# Patient Record
Sex: Male | Born: 1990 | Race: Black or African American | Hispanic: No | Marital: Single | Smoking: Current every day smoker
Health system: Southern US, Community
[De-identification: ages and names within clinical notes are randomized; demographics above are authoritative.]

## PROBLEM LIST (undated history)

## (undated) HISTORY — PX: APPENDECTOMY: SHX54

---

## 2017-02-04 ENCOUNTER — Encounter (HOSPITAL_COMMUNITY)
Admission: EM | Disposition: A | Discharge status: Home / Self Care | Payer: Self-pay | Source: Home / Self Care | Attending: Emergency Medicine

## 2017-02-04 ENCOUNTER — Emergency Department (HOSPITAL_COMMUNITY): Payer: BC Managed Care – PPO

## 2017-02-04 ENCOUNTER — Observation Stay (HOSPITAL_COMMUNITY)
Admission: EM | Admit: 2017-02-04 | Discharge: 2017-02-05 | Disposition: A | Discharge status: Home / Self Care | Payer: BC Managed Care – PPO | Attending: General Surgery | Admitting: General Surgery

## 2017-02-04 ENCOUNTER — Encounter (HOSPITAL_COMMUNITY): Payer: Self-pay | Admitting: *Deleted

## 2017-02-04 DIAGNOSIS — R109 Unspecified abdominal pain: Secondary | ICD-10-CM | POA: Diagnosis present

## 2017-02-04 DIAGNOSIS — K3580 Unspecified acute appendicitis: Principal | ICD-10-CM | POA: Insufficient documentation

## 2017-02-04 DIAGNOSIS — F17200 Nicotine dependence, unspecified, uncomplicated: Secondary | ICD-10-CM | POA: Diagnosis not present

## 2017-02-04 DIAGNOSIS — K37 Unspecified appendicitis: Secondary | ICD-10-CM | POA: Diagnosis present

## 2017-02-04 DIAGNOSIS — K353 Acute appendicitis with localized peritonitis, without perforation or gangrene: Secondary | ICD-10-CM

## 2017-02-04 HISTORY — PX: LAPAROSCOPIC APPENDECTOMY: SHX408

## 2017-02-04 LAB — COMPREHENSIVE METABOLIC PANEL
ALT: 19 U/L (ref 17–63)
AST: 36 U/L (ref 15–41)
Albumin: 3.4 g/dL — ABNORMAL LOW (ref 3.5–5.0)
Alkaline Phosphatase: 57 U/L (ref 38–126)
Anion gap: 8 (ref 5–15)
BUN: 7 mg/dL (ref 6–20)
CHLORIDE: 104 mmol/L (ref 101–111)
CO2: 26 mmol/L (ref 22–32)
Calcium: 8.9 mg/dL (ref 8.9–10.3)
Creatinine, Ser: 1.1 mg/dL (ref 0.61–1.24)
GFR calc non Af Amer: >60 mL/min (ref >60)
Glucose, Bld: 97 mg/dL (ref 65–99)
Potassium: 4.7 mmol/L (ref 3.5–5.1)
SODIUM: 138 mmol/L (ref 135–145)
Total Bilirubin: 1.2 mg/dL (ref 0.3–1.2)
Total Protein: 6.9 g/dL (ref 6.5–8.1)

## 2017-02-04 LAB — URINALYSIS, ROUTINE W REFLEX MICROSCOPIC
BILIRUBIN URINE: NEGATIVE
GLUCOSE, UA: NEGATIVE mg/dL
HGB URINE DIPSTICK: NEGATIVE
Ketones, ur: NEGATIVE mg/dL
Leukocytes, UA: NEGATIVE
Nitrite: NEGATIVE
PH: 7 (ref 5.0–8.0)
Protein, ur: NEGATIVE mg/dL

## 2017-02-04 LAB — LIPASE, BLOOD: Lipase: 18 U/L (ref 11–51)

## 2017-02-04 LAB — CBC
HCT: 40.6 % (ref 39.0–52.0)
HEMOGLOBIN: 13.7 g/dL (ref 13.0–17.0)
MCH: 28.1 pg (ref 26.0–34.0)
MCHC: 33.7 g/dL (ref 30.0–36.0)
MCV: 83.2 fL (ref 78.0–100.0)
Platelets: 196 10*3/uL (ref 150–400)
RBC: 4.88 MIL/uL (ref 4.22–5.81)
RDW: 14.5 % (ref 11.5–15.5)
WBC: 4.3 10*3/uL (ref 4.0–10.5)

## 2017-02-04 SURGERY — APPENDECTOMY, LAPAROSCOPIC
Anesthesia: General | Site: Abdomen

## 2017-02-04 MED ORDER — MIDAZOLAM HCL 2 MG/2ML IJ SOLN
INTRAMUSCULAR | Status: AC
  Filled 2017-02-04: qty 2

## 2017-02-04 MED ORDER — DEXTROSE 5 % IV SOLN
2.0000 g | INTRAVENOUS | Status: DC
  Administered 2017-02-05: 2 g via INTRAVENOUS
  Filled 2017-02-04 (×2): qty 2

## 2017-02-04 MED ORDER — SODIUM CHLORIDE 0.9 % IV BOLUS (SEPSIS)
1000.0000 mL | Freq: Once | INTRAVENOUS | Status: AC
  Administered 2017-02-04: 1000 mL via INTRAVENOUS

## 2017-02-04 MED ORDER — PROPOFOL 10 MG/ML IV BOLUS
INTRAVENOUS | Status: AC
  Filled 2017-02-04: qty 20

## 2017-02-04 MED ORDER — IOPAMIDOL (ISOVUE-300) INJECTION 61%
INTRAVENOUS | Status: AC
  Administered 2017-02-04: 100 mL
  Filled 2017-02-04: qty 100

## 2017-02-04 MED ORDER — ONDANSETRON HCL 4 MG/2ML IJ SOLN: 4.0000 mg | Freq: Four times a day (QID) | INTRAMUSCULAR | Status: DC | PRN

## 2017-02-04 MED ORDER — ACETAMINOPHEN 650 MG RE SUPP: 650.0000 mg | Freq: Four times a day (QID) | RECTAL | Status: DC | PRN

## 2017-02-04 MED ORDER — ENOXAPARIN SODIUM 40 MG/0.4ML ~~LOC~~ SOLN
40.0000 mg | SUBCUTANEOUS | Status: DC
  Administered 2017-02-05: 40 mg via SUBCUTANEOUS
  Filled 2017-02-04: qty 0.4

## 2017-02-04 MED ORDER — ONDANSETRON 4 MG PO TBDP: 4.0000 mg | ORAL_TABLET | Freq: Four times a day (QID) | ORAL | Status: DC | PRN

## 2017-02-04 MED ORDER — METRONIDAZOLE IN NACL 5-0.79 MG/ML-% IV SOLN
500.0000 mg | Freq: Three times a day (TID) | INTRAVENOUS | Status: DC
  Administered 2017-02-05 (×2): 500 mg via INTRAVENOUS
  Filled 2017-02-04 (×4): qty 100

## 2017-02-04 MED ORDER — MORPHINE SULFATE (PF) 4 MG/ML IV SOLN
2.0000 mg | INTRAVENOUS | Status: DC | PRN
  Administered 2017-02-05: 2 mg via INTRAVENOUS
  Filled 2017-02-04: qty 1

## 2017-02-04 MED ORDER — FENTANYL CITRATE (PF) 100 MCG/2ML IJ SOLN
INTRAMUSCULAR | Status: AC
  Filled 2017-02-04: qty 2

## 2017-02-04 MED ORDER — ACETAMINOPHEN 325 MG PO TABS: 650.0000 mg | ORAL_TABLET | Freq: Four times a day (QID) | ORAL | Status: DC | PRN

## 2017-02-04 MED ORDER — SODIUM CHLORIDE 0.9 % IV SOLN
INTRAVENOUS | Status: DC
  Administered 2017-02-04: 23:00:00 via INTRAVENOUS

## 2017-02-04 SURGICAL SUPPLY — 40 items
APPLIER CLIP ROT 10 11.4 M/L (STAPLE)
CANISTER SUCT 3000ML PPV (MISCELLANEOUS) ×3 IMPLANT
CHLORAPREP W/TINT 26ML (MISCELLANEOUS) ×3 IMPLANT
CLIP APPLIE ROT 10 11.4 M/L (STAPLE) IMPLANT
CLOSURE WOUND 1/2 X4 (GAUZE/BANDAGES/DRESSINGS) ×1
COVER SURGICAL LIGHT HANDLE (MISCELLANEOUS) ×3 IMPLANT
CUTTER FLEX LINEAR 45M (STAPLE) ×3 IMPLANT
DERMABOND ADVANCED (GAUZE/BANDAGES/DRESSINGS) ×2
DERMABOND ADVANCED .7 DNX12 (GAUZE/BANDAGES/DRESSINGS) ×1 IMPLANT
DEVICE TROCAR PUNCTURE CLOSURE (ENDOMECHANICALS) ×3 IMPLANT
ELECT REM PT RETURN 9FT ADLT (ELECTROSURGICAL) ×3
ELECTRODE REM PT RTRN 9FT ADLT (ELECTROSURGICAL) ×1 IMPLANT
GLOVE BIO SURGEON STRL SZ7 (GLOVE) ×3 IMPLANT
GLOVE BIOGEL PI IND STRL 7.5 (GLOVE) ×1 IMPLANT
GLOVE BIOGEL PI INDICATOR 7.5 (GLOVE) ×2
GOWN STRL REUS W/ TWL LRG LVL3 (GOWN DISPOSABLE) ×3 IMPLANT
GOWN STRL REUS W/TWL LRG LVL3 (GOWN DISPOSABLE) ×6
KIT BASIN OR (CUSTOM PROCEDURE TRAY) ×3 IMPLANT
KIT ROOM TURNOVER OR (KITS) ×3 IMPLANT
NS IRRIG 1000ML POUR BTL (IV SOLUTION) ×3 IMPLANT
PAD ARMBOARD 7.5X6 YLW CONV (MISCELLANEOUS) ×6 IMPLANT
POUCH RETRIEVAL ECOSAC 10 (ENDOMECHANICALS) ×1 IMPLANT
POUCH RETRIEVAL ECOSAC 10MM (ENDOMECHANICALS) ×2
RELOAD 45 VASCULAR/THIN (ENDOMECHANICALS) IMPLANT
RELOAD STAPLE TA45 3.5 REG BLU (ENDOMECHANICALS) ×3 IMPLANT
SCISSORS LAP 5X35 DISP (ENDOMECHANICALS) ×3 IMPLANT
SET IRRIG TUBING LAPAROSCOPIC (IRRIGATION / IRRIGATOR) ×3 IMPLANT
SHEARS HARMONIC ACE PLUS 36CM (ENDOMECHANICALS) ×3 IMPLANT
SLEEVE ENDOPATH XCEL 5M (ENDOMECHANICALS) ×3 IMPLANT
SPECIMEN JAR SMALL (MISCELLANEOUS) ×3 IMPLANT
STRIP CLOSURE SKIN 1/2X4 (GAUZE/BANDAGES/DRESSINGS) ×2 IMPLANT
SUT MNCRL AB 4-0 PS2 18 (SUTURE) ×6 IMPLANT
SUT VICRYL 0 UR6 27IN ABS (SUTURE) ×6 IMPLANT
TOWEL OR 17X24 6PK STRL BLUE (TOWEL DISPOSABLE) ×3 IMPLANT
TOWEL OR 17X26 10 PK STRL BLUE (TOWEL DISPOSABLE) ×3 IMPLANT
TRAY FOLEY CATH 16FR SILVER (SET/KITS/TRAYS/PACK) ×3 IMPLANT
TRAY LAPAROSCOPIC MC (CUSTOM PROCEDURE TRAY) ×3 IMPLANT
TROCAR XCEL BLUNT TIP 100MML (ENDOMECHANICALS) ×3 IMPLANT
TROCAR XCEL NON-BLD 5MMX100MML (ENDOMECHANICALS) ×3 IMPLANT
TUBING INSUFFLATION (TUBING) ×3 IMPLANT

## 2017-02-04 NOTE — Consult Note (Signed)
Matthew Miranda Jan 15, 1991  [id number].    Requesting MD: Waynetta Pean PA-C Chief Complaint/Reason for Consult: Abdominal pain   HPI:   Matthew Miranda is a 26 year old male with no significant past medical history.  The patient presented to the ED earlier today with a 3 day history of abdominal pain. He states that it began on Tuesday and has been fairly constant since then. He notes that it is mostly in the lower quadrants, with the worst pain at the lower left quadrant. The patient reports that the pain is mild when he is laying down or resting, but movement and touch exacerbate the pain. He denies any other symptoms of fever, chills, nausea, vomiting, diarrhea, constipation. Other than the continuous abdominal pain, the patient states he has been doing well.   He denies any previous episodes of pain that are similar to this and has never had any abdominal surgeries. He reports that his last BM was last night. He has been NPO since 11AM this morning.   ROS: All systems reviewed and otherwise negative except for as above.  History reviewed. No pertinent family history.  History reviewed. No pertinent past medical history.  History reviewed. No pertinent surgical history.  Social History:  reports that he has been smoking.  He does not have any smokeless tobacco history on file. He reports that he drinks alcohol. He reports that he uses drugs, including Marijuana.  Allergies: No Known Allergies  Medications: none  Blood pressure 130/84, pulse 62, temperature 98.2 F (36.8 C), temperature source Oral, resp. rate 16, SpO2 100 %. Physical Exam: General: pleasant, WD/WN male, that appears in NAD and in mild discomfort. HEENT: PERRL, sclera is without icterus, buccal mucosa is pink and moist. Heart: regular, rate, and rhythm.  No obvious murmurs, gallops, or rubs noted.  Palpable pedal pulses bilaterally  Lungs: Clear to  auscultation bilaterally. No accessory muscle usage is noted. Abd: Soft, with mild tenderness to palpation and rebound tenderness noted in the LLQ. No pain in other quadrants. Normoactive bowel sounds. No scars noted. MS: No cyanosis, clubbing, or edema. Skin: warm and dry with no masses, lesions, or rashes Psych: A&Ox3 with an appropriate affect.   Results for orders placed or performed during the hospital encounter of 02/04/17 (from the past 48 hour(s))  Lipase, blood     Status: None   Collection Time: 02/04/17  1:14 PM  Result Value Ref Range   Lipase 18 11 - 51 U/L  Comprehensive metabolic panel     Status: Abnormal   Collection Time: 02/04/17  1:14 PM  Result Value Ref Range   Sodium 138 135 - 145 mmol/L   Potassium 4.7 3.5 - 5.1 mmol/L    Comment: SPECIMEN HEMOLYZED. HEMOLYSIS MAY AFFECT INTEGRITY OF RESULTS.   Chloride 104 101 - 111 mmol/L   CO2 26 22 - 32 mmol/L   Glucose, Bld 97 65 - 99 mg/dL   BUN 7 6 - 20 mg/dL   Creatinine, Ser 1.10 0.61 - 1.24 mg/dL   Calcium 8.9 8.9 - 10.3 mg/dL   Total Protein 6.9 6.5 - 8.1 g/dL   Albumin 3.4 (L) 3.5 - 5.0 g/dL   AST 36 15 - 41 U/L   ALT 19 17 - 63 U/L   Alkaline Phosphatase 57 38 - 126 U/L   Total Bilirubin 1.2 0.3 - 1.2 mg/dL   GFR calc non Af Amer >60 >60 mL/min   GFR calc  Af Amer >60 >60 mL/min    Comment: (NOTE) The eGFR has been calculated using the CKD EPI equation. This calculation has not been validated in all clinical situations. eGFR's persistently <60 mL/min signify possible Chronic Kidney Disease.    Anion gap 8 5 - 15  CBC     Status: None   Collection Time: 02/04/17  1:14 PM  Result Value Ref Range   WBC 4.3 4.0 - 10.5 K/uL   RBC 4.88 4.22 - 5.81 MIL/uL   Hemoglobin 13.7 13.0 - 17.0 g/dL   HCT 40.6 39.0 - 52.0 %   MCV 83.2 78.0 - 100.0 fL   MCH 28.1 26.0 - 34.0 pg   MCHC 33.7 30.0 - 36.0 g/dL   RDW 14.5 11.5 - 15.5 %   Platelets 196 150 - 400 K/uL   Ct Abdomen Pelvis W Contrast  Result Date:  02/04/2017 CLINICAL DATA:  Right lower quadrant pain with nausea EXAM: CT ABDOMEN AND PELVIS WITH CONTRAST TECHNIQUE: Multidetector CT imaging of the abdomen and pelvis was performed using the standard protocol following bolus administration of intravenous contrast. CONTRAST:  181m ISOVUE-300 IOPAMIDOL (ISOVUE-300) INJECTION 61% COMPARISON:  None. FINDINGS: Lower chest: No acute abnormality. Hepatobiliary: No focal liver abnormality is seen. No gallstones, gallbladder wall thickening, or biliary dilatation. Pancreas: Unremarkable. No pancreatic ductal dilatation or surrounding inflammatory changes. Spleen: Normal in size without focal abnormality. Adrenals/Urinary Tract: Adrenal glands are unremarkable. Kidneys are normal, without renal calculi, focal lesion, or hydronephrosis. Bladder is unremarkable. Stomach/Bowel: Stomach is nonenlarged. No dilated small bowel. No colon wall thickening. Abnormal appendix in the right lower quadrant. Appendix measures up to 11 mm and demonstrates wall enhancement and surrounding inflammatory changes. This would be consistent with acute appendicitis. No extraluminal gas. No abscess. Vascular/Lymphatic: Non aneurysmal aorta. Enlarged lymph node at the iliac bifurcation measuring 8 mm. Few lymph nodes along the mesenteric root, likely reactive. Reproductive: Prostate is unremarkable. Other: No abdominal wall hernia or abnormality. Trace free fluid in the pelvis. Musculoskeletal: No acute or significant osseous findings. IMPRESSION: 1. Abnormally enlarged appendix with wall enhancement and surrounding edema, consistent with acute appendicitis. No extraluminal gas, abscess or free air 2. Trace free fluid in the pelvis Electronically Signed   By: KDonavan FoilM.D.   On: 02/04/2017 18:32      Assessment/Plan 1. Acute appendicitis: - Patient noted to have an enlarged appendix on CT; Will admit patient for surgical intervention to be performed at a later point.; - Continue pain  management as needed; Continue NPO until surgery time can be scheduled. - Start antibiotic therapy  NRoselle Locus PA-S  rlq pain and tenderness on his examination, ct scan is consistent with appendicitis, I discussed lap appy with risks and doing this tonight. Will give abx and admit, have trauma that is currently being evaluated.  Discussed open procedure, injury and possible postop abscess

## 2017-02-04 NOTE — ED Notes (Signed)
Attempted to call report

## 2017-02-04 NOTE — ED Provider Notes (Signed)
[facility]-EMERGENCY DEPT Provider Note   CSN: [id number] Arrival date & time: 02/04/17  1258     History   Chief Complaint Chief Complaint  Patient presents with  . Abdominal Pain    HPI Matthew Miranda is a 26 y.o. male.  Matthew Miranda is a 26 y.o. Male who is otherwise healthy who presents to the ED complaining of right lower quadrant abdominal pain for the past three days. He reports the pain is mostly constant and today can be worse with movement. He denies any nausea, vomiting or diarrhea. He denies colicky pain.  No previous abdominal surgeries. No treatments attempted prior to arrival. Patient denies fevers, recent illness, testicle pain, penile pain, back pain, nausea, vomiting, diarrhea, cough, chest pain or shortness of breath.   The history is provided by the patient, medical records and a parent. No language interpreter was used.  Abdominal Pain   Pertinent negatives include fever, diarrhea, nausea, vomiting, constipation, dysuria, frequency, hematuria and headaches.    History reviewed. No pertinent past medical history.  Patient Active Problem List   Diagnosis Date Noted  . Appendicitis 02/04/2017    History reviewed. No pertinent surgical history.     Home Medications    Prior to Admission medications   Not on File    Family History History reviewed. No pertinent family history.  Social History Social History  Substance Use Topics  . Smoking status: Current Every Day Smoker  . Smokeless tobacco: Not on file  . Alcohol use Yes     Allergies   Patient has no known allergies.   Review of Systems Review of Systems  Constitutional: Negative for chills and fever.  HENT: Negative for congestion and sore throat.   Eyes: Negative for visual disturbance.  Respiratory: Negative for cough and shortness of breath.   Cardiovascular: Negative for chest pain.  Gastrointestinal: Positive for abdominal pain. Negative for abdominal  distention, blood in stool, constipation, diarrhea, nausea and vomiting.  Genitourinary: Negative for decreased urine volume, difficulty urinating, dysuria, frequency, hematuria, penile pain and testicular pain.  Musculoskeletal: Negative for back pain and neck pain.  Skin: Negative for rash.  Neurological: Negative for headaches.     Physical Exam Updated Vital Signs BP 131/79 (BP Location: Left Arm)   Pulse 60   Temp 98.6 F (37 C) (Oral)   Resp 18   Ht 5\' 11"  (1.803 m)   Wt 94.5 kg   SpO2 100%   BMI 29.05 kg/m   Physical Exam  Constitutional: He appears well-developed and well-nourished. No distress.  Nontoxic appearing.  HENT:  Head: Normocephalic and atraumatic.  Mouth/Throat: Oropharynx is clear and moist.  Eyes: Conjunctivae are normal. Pupils are equal, round, and reactive to light. Right eye exhibits no discharge. Left eye exhibits no discharge.  Neck: Neck supple.  Cardiovascular: Normal rate, regular rhythm, normal heart sounds and intact distal pulses.  Exam reveals no gallop and no friction rub.   No murmur heard. Pulmonary/Chest: Effort normal and breath sounds normal. No respiratory distress. He has no wheezes. He has no rales.  Abdominal: Soft. Bowel sounds are normal. He exhibits no distension and no mass. There is tenderness. There is guarding. There is no rebound. No hernia.  Abdomen is soft. Bowel sounds are present. Patient has right lower quadrant tenderness to palpation. No rebound tenderness. There is mild guarding with palpation of his right lower quadrant. No psoas or obturator sign. No CVA or flank tenderness.  Musculoskeletal: He exhibits no edema.  Lymphadenopathy:    He has no cervical adenopathy.  Neurological: He is alert. Coordination normal.  Skin: Skin is warm and dry. No rash noted. He is not diaphoretic. No erythema. No pallor.  Psychiatric: He has a normal mood and affect. His behavior is normal.  Nursing note and vitals  reviewed.    ED Treatments / Results  Labs (all labs ordered are listed, but only abnormal results are displayed) Labs Reviewed  COMPREHENSIVE METABOLIC PANEL - Abnormal; Notable for the following:       Result Value   Albumin 3.4 (*)    All other components within normal limits  URINALYSIS, ROUTINE W REFLEX MICROSCOPIC - Abnormal; Notable for the following:    APPearance HAZY (*)    Specific Gravity, Urine >1.046 (*)    All other components within normal limits  SURGICAL PCR SCREEN  LIPASE, BLOOD  CBC    EKG  EKG Interpretation None       Radiology Ct Abdomen Pelvis W Contrast  Result Date: 02/04/2017 CLINICAL DATA:  Right lower quadrant pain with nausea EXAM: CT ABDOMEN AND PELVIS WITH CONTRAST TECHNIQUE: Multidetector CT imaging of the abdomen and pelvis was performed using the standard protocol following bolus administration of intravenous contrast. CONTRAST:  ISOVUE-300 IOPAMIDOL (ISOVUE-300) INJECTION 61% COMPARISON:  None. FINDINGS: Lower chest: No acute abnormality. Hepatobiliary: No focal liver abnormality is seen. No gallstones, gallbladder wall thickening, or biliary dilatation. Pancreas: Unremarkable. No pancreatic ductal dilatation or surrounding inflammatory changes. Spleen: Normal in size without focal abnormality. Adrenals/Urinary Tract: Adrenal glands are unremarkable. Kidneys are normal, without renal calculi, focal lesion, or hydronephrosis. Bladder is unremarkable. Stomach/Bowel: Stomach is nonenlarged. No dilated small bowel. No colon wall thickening. Abnormal appendix in the right lower quadrant. Appendix measures up to 11 mm and demonstrates wall enhancement and surrounding inflammatory changes. This would be consistent with acute appendicitis. No extraluminal gas. No abscess. Vascular/Lymphatic: Non aneurysmal aorta. Enlarged lymph node at the iliac bifurcation measuring 8 mm. Few lymph nodes along the mesenteric root, likely reactive. Reproductive:  Prostate is unremarkable. Other: No abdominal wall hernia or abnormality. Trace free fluid in the pelvis. Musculoskeletal: No acute or significant osseous findings. IMPRESSION: 1. Abnormally enlarged appendix with wall enhancement and surrounding edema, consistent with acute appendicitis. No extraluminal gas, abscess or free air 2. Trace free fluid in the pelvis Electronically Signed   By: Jasmine Pang M.D.   On: 02/04/2017 18:32    Procedures Procedures (including critical care time)  Medications Ordered in ED Medications  enoxaparin (LOVENOX) injection 40 mg (not administered)  0.9 %  sodium chloride infusion ( Intravenous New Bag/Given 02/04/17 2312)  cefTRIAXone (ROCEPHIN) 2 g in dextrose 5 % 50 mL IVPB (not administered)    And  metroNIDAZOLE (FLAGYL) IVPB 500 mg (not administered)  acetaminophen (TYLENOL) tablet 650 mg (not administered)    Or  acetaminophen (TYLENOL) suppository 650 mg (not administered)  morphine 4 MG/ML injection 2 mg (not administered)  ondansetron (ZOFRAN-ODT) disintegrating tablet 4 mg (not administered)    Or  ondansetron (ZOFRAN) injection 4 mg (not administered)  sodium chloride 0.9 % bolus 1,000 mL (0 mLs Intravenous Stopped 02/04/17 1857)  iopamidol (ISOVUE-300) 61 % injection (100 mLs  Contrast Given 02/04/17 1810)     Initial Impression / Assessment and Plan / ED Course  I have reviewed the triage vital signs and the nursing notes.  Pertinent labs & imaging results that were available during my care of the patient were reviewed by me  and considered in my medical decision making (see chart for details).     This is a [age] y.o. Male who is otherwise healthy who presents to the ED complaining of right lower quadrant abdominal pain for the past three days. He reports the pain is mostly constant and today can be worse with movement. He denies any nausea, vomiting or diarrhea. He denies colicky pain.  No previous abdominal surgeries.  He was sent from urgent  care to rule out appendicitis. On exam the patient is afebrile nontoxic appearing. His abdomen is soft and his right lower quadrant abdominal tenderness to palpation. No psoas or obturator sign. Blood work is unremarkable. No leukocytosis.  CT abdomen and pelvis was obtained which showed evidence for acute appendicitis.  I discussed results with the patient who agrees with plan for general surgery consult.   I consulted with general surgeon Dr. Dwain SarnaWakefield who admitted the patient and plans to do appendectomy.    Final Clinical Impressions(s) / ED Diagnoses   Final diagnoses:  Acute appendicitis with localized peritonitis    New Prescriptions There are no discharge medications for this patient.    Everlene FarrierWilliam Panzy Bubeck, PA-C 02/05/17 [id number]    Rolan BuccoMelanie Belfi, MD 02/05/17 1047

## 2017-02-04 NOTE — ED Triage Notes (Signed)
Pt reports RLQ pain x 3 days. Denies fever or n/v.

## 2017-02-04 NOTE — ED Notes (Signed)
Pt reports unable to obtain urine sample at triage. 

## 2017-02-04 NOTE — ED Notes (Signed)
Attempted to call report. RN to call back. 

## 2017-02-04 NOTE — ED Notes (Addendum)
Surgeon at bedside.  

## 2017-02-04 NOTE — Anesthesia Preprocedure Evaluation (Addendum)
Anesthesia Evaluation  Patient identified by MRN, date of birth, ID band Patient awake    Reviewed: Allergy & Precautions  Airway Mallampati: I  TM Distance: >3 FB     Dental   Pulmonary Current Smoker,    breath sounds clear to auscultation       Cardiovascular negative cardio ROS   Rhythm:Regular Rate:Normal     Neuro/Psych    GI/Hepatic Neg liver ROS, History noted. CG   Endo/Other  negative endocrine ROS  Renal/GU negative Renal ROS     Musculoskeletal   Abdominal   Peds  Hematology   Anesthesia Other Findings   Reproductive/Obstetrics                            Anesthesia Physical Anesthesia Plan  ASA: I and emergent  Anesthesia Plan: General   Post-op Pain Management:    Induction: Intravenous, Rapid sequence and Cricoid pressure planned  Airway Management Planned:   Additional Equipment:   Intra-op Plan:   Post-operative Plan: Extubation in OR  Informed Consent: I have reviewed the patients History and Physical, chart, labs and discussed the procedure including the risks, benefits and alternatives for the proposed anesthesia with the patient or authorized representative who has indicated his/her understanding and acceptance.   Dental advisory given  Plan Discussed with: CRNA, Anesthesiologist and Surgeon  Anesthesia Plan Comments:        Anesthesia Quick Evaluation

## 2017-02-05 ENCOUNTER — Observation Stay (HOSPITAL_COMMUNITY): Payer: BC Managed Care – PPO | Admitting: Anesthesiology

## 2017-02-05 ENCOUNTER — Encounter (HOSPITAL_COMMUNITY): Payer: Self-pay | Admitting: General Surgery

## 2017-02-05 LAB — SURGICAL PCR SCREEN
MRSA, PCR: NEGATIVE
Staphylococcus aureus: NEGATIVE

## 2017-02-05 MED ORDER — 0.9 % SODIUM CHLORIDE (POUR BTL) OPTIME
TOPICAL | Status: DC | PRN
Start: 2017-02-05 — End: 2017-02-05
  Administered 2017-02-05: 200 mL

## 2017-02-05 MED ORDER — SODIUM CHLORIDE 0.9 % IR SOLN
Status: DC | PRN
  Administered 2017-02-05: 1000 mL

## 2017-02-05 MED ORDER — HYDROMORPHONE HCL 1 MG/ML IJ SOLN
INTRAMUSCULAR | Status: AC
  Filled 2017-02-05: qty 1

## 2017-02-05 MED ORDER — MIDAZOLAM HCL 5 MG/5ML IJ SOLN
INTRAMUSCULAR | Status: DC | PRN
  Administered 2017-02-05: 2 mg via INTRAVENOUS

## 2017-02-05 MED ORDER — ONDANSETRON HCL 4 MG/2ML IJ SOLN
INTRAMUSCULAR | Status: DC | PRN
  Administered 2017-02-05: 4 mg via INTRAVENOUS

## 2017-02-05 MED ORDER — PROPOFOL 10 MG/ML IV BOLUS
INTRAVENOUS | Status: DC | PRN
  Administered 2017-02-05: 200 mg via INTRAVENOUS

## 2017-02-05 MED ORDER — HYDROMORPHONE HCL 1 MG/ML IJ SOLN
INTRAMUSCULAR | Status: AC
Start: 2017-02-05 — End: 2017-02-05
  Filled 2017-02-05: qty 1

## 2017-02-05 MED ORDER — FENTANYL CITRATE (PF) 100 MCG/2ML IJ SOLN
INTRAMUSCULAR | Status: DC | PRN
  Administered 2017-02-05: 100 ug via INTRAVENOUS

## 2017-02-05 MED ORDER — OXYCODONE-ACETAMINOPHEN 5-325 MG PO TABS
1.0000 | ORAL_TABLET | ORAL | Status: DC | PRN
  Administered 2017-02-05: 2 via ORAL
  Filled 2017-02-05: qty 2

## 2017-02-05 MED ORDER — BUPIVACAINE HCL (PF) 0.25 % IJ SOLN
INTRAMUSCULAR | Status: AC
  Filled 2017-02-05: qty 30

## 2017-02-05 MED ORDER — SUGAMMADEX SODIUM 500 MG/5ML IV SOLN
INTRAVENOUS | Status: DC | PRN
  Administered 2017-02-05: 300 mg via INTRAVENOUS

## 2017-02-05 MED ORDER — ROCURONIUM BROMIDE 100 MG/10ML IV SOLN
INTRAVENOUS | Status: DC | PRN
Start: 2017-02-05 — End: 2017-02-05
  Administered 2017-02-05: 10 mg via INTRAVENOUS
  Administered 2017-02-05: 50 mg via INTRAVENOUS

## 2017-02-05 MED ORDER — HYDROMORPHONE HCL 1 MG/ML IJ SOLN
0.2500 mg | INTRAMUSCULAR | Status: DC | PRN
  Administered 2017-02-05: 0.5 mg via INTRAVENOUS

## 2017-02-05 MED ORDER — LACTATED RINGERS IV SOLN
INTRAVENOUS | Status: DC | PRN
  Administered 2017-02-05 (×2): via INTRAVENOUS

## 2017-02-05 MED ORDER — KETOROLAC TROMETHAMINE 15 MG/ML IJ SOLN
15.0000 mg | Freq: Three times a day (TID) | INTRAMUSCULAR | Status: DC | PRN
  Filled 2017-02-05: qty 1

## 2017-02-05 MED ORDER — BUPIVACAINE HCL (PF) 0.25 % IJ SOLN
INTRAMUSCULAR | Status: DC | PRN
Start: 2017-02-05 — End: 2017-02-05
  Administered 2017-02-05: 6 mL

## 2017-02-05 MED ORDER — OXYCODONE-ACETAMINOPHEN 5-325 MG PO TABS: 1.0000 | ORAL_TABLET | ORAL | 0 refills | Status: DC | PRN

## 2017-02-05 MED ORDER — LIDOCAINE HCL (CARDIAC) 20 MG/ML IV SOLN
INTRAVENOUS | Status: DC | PRN
  Administered 2017-02-05: 100 mg via INTRAVENOUS

## 2017-02-05 MED ORDER — HYDROMORPHONE HCL 1 MG/ML IJ SOLN
INTRAMUSCULAR | Status: DC | PRN
  Administered 2017-02-05 (×2): 1 mg via INTRAVENOUS

## 2017-02-05 MED ORDER — PHENYLEPHRINE HCL 10 MG/ML IJ SOLN
INTRAMUSCULAR | Status: DC | PRN
  Administered 2017-02-05: 80 ug via INTRAVENOUS

## 2017-02-05 MED ORDER — SUCCINYLCHOLINE CHLORIDE 20 MG/ML IJ SOLN
INTRAMUSCULAR | Status: DC | PRN
  Administered 2017-02-05: 120 mg via INTRAVENOUS

## 2017-02-05 NOTE — Discharge Instructions (Signed)
Please call to schedule a follow up appointment. Please arrive at least 30 min before your appointment to complete your check in paperwork.  If you are unable to arrive 30 min prior to your appointment time we may have to cancel or reschedule you.  LAPAROSCOPIC SURGERY: POST OP INSTRUCTIONS  1. DIET: Follow a light bland diet the first 24 hours after arrival home, such as soup, liquids, crackers, etc. Be sure to include lots of fluids daily. Avoid fast food or heavy meals as your are more likely to get nauseated. Eat a low fat the next few days after surgery.  2. Take your usually prescribed home medications unless otherwise directed. 3. PAIN CONTROL:  1. Pain is best controlled by a usual combination of three different methods TOGETHER:  1. Ice/Heat 2. Over the counter pain medication 3. Prescription pain medication 2. Most patients will experience some swelling and bruising around the incisions. Ice packs or heating pads (30-60 minutes up to 6 times a day) will help. Use ice for the first few days to help decrease swelling and bruising, then switch to heat to help relax tight/sore spots and speed recovery. Some people prefer to use ice alone, heat alone, alternating between ice & heat. Experiment to what works for you. Swelling and bruising can take several weeks to resolve.  3. It is helpful to take an over-the-counter pain medication regularly for the first few weeks. Choose one of the following that works best for you:  1. Naproxen (Aleve, etc) Two 220mg  tabs twice a day 2. Ibuprofen (Advil, etc) Three 200mg  tabs four times a day (every meal & bedtime) 3. Acetaminophen (Tylenol, etc) 500-650mg  four times a day (every meal & bedtime) 4. A prescription for pain medication (such as oxycodone, hydrocodone, etc) should be given to you upon discharge. Take your pain medication as prescribed.  1. If you are having problems/concerns with the prescription medicine (does not control pain, nausea,  vomiting, rash, itching, etc), please call us [phone] to see if we need to switch you to a different pain medicine that will work better for you and/or control your side effect better. 2. If you need a refill on your pain medication, please contact your pharmacy. They will contact our office to request authorization. Prescriptions will not be filled after 5 pm or on week-ends. 4. Avoid getting constipated. Between the surgery and the pain medications, it is common to experience some constipation. Increasing fluid intake and taking a fiber supplement (such as Metamucil, Citrucel, FiberCon, MiraLax, etc) 1-2 times a day regularly will usually help prevent this problem from occurring. A mild laxative (prune juice, Milk of Magnesia, MiraLax, etc) should be taken according to package directions if there are no bowel movements after 48 hours.  5. Watch out for diarrhea. If you have many loose bowel movements, simplify your diet to bland foods & liquids for a few days. Stop any stool softeners and decrease your fiber supplement. Switching to mild anti-diarrheal medications (Kayopectate, Pepto Bismol) can help. If this worsens or does not improve, please call us. 6. Wash / shower every day. You may shower over the dressings as they are waterproof. Continue to shower over incision(s) after the dressing is off. 7. Remove your waterproof bandages 5 days after surgery. You may leave the incision open to air. You may replace a dressing/Band-Aid to cover the incision for comfort if you wish.  8. ACTIVITIES as tolerated:  1. You may resume regular (light) daily activities beginning the  next day--such as daily self-care, walking, climbing stairs--gradually increasing activities as tolerated. If you can walk 30 minutes without difficulty, it is safe to try more intense activity such as jogging, treadmill, bicycling, low-impact aerobics, swimming, etc. 2. Save the most intensive and strenuous activity for last such as  sit-ups, heavy lifting, contact sports, etc Refrain from any heavy lifting or straining until you are off narcotics for pain control.  3. DO NOT PUSH THROUGH PAIN. Let pain be your guide: If it hurts to do something, don't do it. Pain is your body warning you to avoid that activity for another week until the pain goes down. 4. You may drive when you are no longer taking prescription pain medication, you can comfortably wear a seatbelt, and you can safely maneuver your car and apply brakes. 5. You may have sexual intercourse when it is comfortable.  9. FOLLOW UP in our office  1. Please call [facility] at [phone] to set up an appointment to see your surgeon in the office for a follow-up appointment approximately 2-3 weeks after your surgery. 2. Make sure that you call for this appointment the day you arrive home to insure a convenient appointment time.      10. IF YOU HAVE DISABILITY OR FAMILY LEAVE FORMS, BRING THEM TO THE               OFFICE FOR PROCESSING.   WHEN TO CALL US [phone]:  1. Poor pain control 2. Reactions / problems with new medications (rash/itching, nausea, etc)  3. Fever over 101.5 F (38.5 C) 4. Inability to urinate 5. Nausea and/or vomiting 6. Worsening swelling or bruising 7. Continued bleeding from incision. 8. Increased pain, redness, or drainage from the incision  The clinic staff is available to answer your questions during regular business hours (8:30am-5pm). Please dont hesitate to call and ask to speak to one of our nurses for clinical concerns.  If you have a medical emergency, go to the nearest emergency room or call 911.  A surgeon from [facility] is always on call at the [facility]   [facility] [place]  [address], [address], [place], [place] [address] ?  MAIN: [phone] ? TOLL FREE: [phone] ?  FAX [phone]  [email or web address]

## 2017-02-05 NOTE — Op Note (Signed)
Preoperative diagnosis: acute appendicitis Postoperative diagnosis: same as above Procedure: laparoscopic appendectomy Surgeon: Dr Harden MoMatt Adalynne Steffensmeier Anesthesia: general EBL: minimal Drains none Specimen: appendix to pathology Complications: none Sponge count correct at completion Disposition to recovery stable  Indications: This is a [age] yom with acute appendicitis on ct scan and by clinical exam.  We discussed laparoscopic appendectomy.   Procedure: After informed consent was obtained the patient was taken to the operating room. He was already onantibiotics. Sequential compression devices were on hislegs. Hewas placed under general anesthesia without complication. His abdomen was prepped and draped in the standard sterile surgical fashion. A surgical timeout was then performed.  I infiltrated marcaine belowthe umbilicus. I made a vertical incision and grasped the fascia. This was entered sharply. I then placed a 0 vicryl pursestring suture and inserted a Hasson trocarI then placed 2 other 5 mm trocars in llq and suprapubic region. I identified the appendix and grasped the base.  The base was clean. I divided the mesentery with the harmonic scalpel. I then divided the appendix. This was placed in a bag and removed. I viewed the staple line on the cecum and this appears to be intact and viable. There was no injury to cecum or small bowel noted. I then obtained hemostasis and irrigated.I removed the hasson trocar. I tied my pursestring down and added a 0vicryl stitchat the umbilical incision with the endoclose device. I then desufflated the abdomen and removed all my remaining trocars. I then closed these with 4-0 Monocryl and Dermabond. He tolerated this well was extubated and transferred to the recovery room in stable condition

## 2017-02-05 NOTE — Anesthesia Procedure Notes (Signed)
Procedure Name: Intubation Date/Time: 02/05/2017 12:44 AM Performed by: Manuela Schwartz B Pre-anesthesia Checklist: Patient identified, Emergency Drugs available, Suction available, Patient being monitored and Timeout performed Patient Re-evaluated:Patient Re-evaluated prior to inductionOxygen Delivery Method: Circle system utilized Preoxygenation: Pre-oxygenation with 100% oxygen Intubation Type: IV induction, Rapid sequence and Cricoid Pressure applied Laryngoscope Size: Mac and 3 Grade View: Grade I Tube type: Oral Tube size: 7.5 mm Number of attempts: 1 Airway Equipment and Method: Stylet Placement Confirmation: ETT inserted through vocal cords under direct vision,  positive ETCO2 and breath sounds checked- equal and bilateral Secured at: 23 cm Tube secured with: Tape Dental Injury: Teeth and Oropharynx as per pre-operative assessment

## 2017-02-05 NOTE — Anesthesia Postprocedure Evaluation (Signed)
Anesthesia Post Note  Patient: Matthew Miranda  Procedure(s) Performed: Procedure(s) (LRB): APPENDECTOMY LAPAROSCOPIC (N/A)  Patient location during evaluation: PACU Anesthesia Type: General Level of consciousness: awake Pain management: pain level controlled Vital Signs Assessment: post-procedure vital signs reviewed and stable Respiratory status: spontaneous breathing Cardiovascular status: stable Anesthetic complications: no       Last Vitals:  Vitals:   02/05/17 0300 02/05/17 0328  BP:  (!) 143/71  Pulse:  77  Resp:  16  Temp: 36.8 C 36.9 C    Last Pain:  Vitals:   02/05/17 0328  TempSrc: Oral  PainSc:                  [name]

## 2017-02-05 NOTE — Discharge Summary (Signed)
[facility] [facility]   Patient ID: Matthew Miranda MRN: [id number] DOB/AGE: 1991-04-02 26 y.o.  Admit date: 02/04/2017 Discharge date: 02/05/2017  Admitting Diagnosis: appendicitis  Discharge Diagnosis Patient Active Problem List   Diagnosis Date Noted  . Appendicitis 02/04/2017    Consultants None  Imaging: Ct Abdomen Pelvis W Contrast  Result Date: 02/04/2017 CLINICAL DATA:  Right lower quadrant pain with nausea EXAM: CT ABDOMEN AND PELVIS WITH CONTRAST TECHNIQUE: Multidetector CT imaging of the abdomen and pelvis was performed using the standard protocol following bolus administration of intravenous contrast. CONTRAST:  100mL ISOVUE-300 IOPAMIDOL (ISOVUE-300) INJECTION 61% COMPARISON:  None. FINDINGS: Lower chest: No acute abnormality. Hepatobiliary: No focal liver abnormality is seen. No gallstones, gallbladder wall thickening, or biliary dilatation. Pancreas: Unremarkable. No pancreatic ductal dilatation or surrounding inflammatory changes. Spleen: Normal in size without focal abnormality. Adrenals/Urinary Tract: Adrenal glands are unremarkable. Kidneys are normal, without renal calculi, focal lesion, or hydronephrosis. Bladder is unremarkable. Stomach/Bowel: Stomach is nonenlarged. No dilated small bowel. No colon wall thickening. Abnormal appendix in the right lower quadrant. Appendix measures up to 11 mm and demonstrates wall enhancement and surrounding inflammatory changes. This would be consistent with acute appendicitis. No extraluminal gas. No abscess. Vascular/Lymphatic: Non aneurysmal aorta. Enlarged lymph node at the iliac bifurcation measuring 8 mm. Few lymph nodes along the mesenteric root, likely reactive. Reproductive: Prostate is unremarkable. Other: No abdominal wall hernia or abnormality. Trace free fluid in the pelvis. Musculoskeletal: No acute or significant osseous findings. IMPRESSION: 1. Abnormally enlarged appendix with wall enhancement  and surrounding edema, consistent with acute appendicitis. No extraluminal gas, abscess or free air 2. Trace free fluid in the pelvis Electronically Signed   By: Matthew PangKim  Miranda M.D.   On: 02/04/2017 18:32    Procedures Dr. Dwain [facility] (02/05/17) - Laparoscopic Appendectomy  Hospital Course:  Matthew Geroldravores Miranda is a 26 year old male who [facility].  Workup showed appendicitis.  Patient was admitted and underwent procedure listed above.  Tolerated procedure well and was transferred to the floor.  Diet was advanced as tolerated.  On POD#0, the patient was voiding well, tolerating diet, ambulating well, pain well controlled, vital signs stable, incisions c/d/i and felt stable for discharge home.  Patient will follow up in our office in 2 weeks and knows to call with questions or concerns.  He will call to schedule an appointment for follow up.   Patient was discharged in good condition.  [place] Sub[place] controlled database was reviewed prior to prescribing narcotic pain medication to this patient.  Physical Exam: General:  Alert, NAD, pleasant, cooperative, well appearing Cardio: RRR, S1 & S2 normal, no murmur, rubs, gallops Resp: Effort normal, lungs CTA bilaterally, no wheezes, rales, rhonchi Abd:  Soft, ND, mild generalized tenderness worse around incisions, incisions C/D/I   Allergies as of 02/05/2017   No Known Allergies     Medication List    TAKE these medications   oxyCODONE-acetaminophen 5-325 MG tablet Commonly known as:  PERCOCET/ROXICET Take 1-2 tablets by mouth every 4 (four) hours as needed for moderate pain.        Follow-up Information    [facility] [place] Call.   Specialty:  General Surgery Why:  to schedule an appointment for follow up in our clinic in 2-3 weeks Contact information: [address] [place] [id number] [phone]          Signed: Joyce CopaJessica L [facility]    Surgery 02/05/2017, 9:57 AM Pager: [phone] Consults: [phone] Mon-Fri 7:00 am-4:30 pm Sat-Sun 7:00 am-11:30 am

## 2017-02-05 NOTE — Transfer of Care (Signed)
Immediate Anesthesia Transfer of Care Note  Patient: Matthew Miranda  Procedure(s) Performed: Procedure(s): APPENDECTOMY LAPAROSCOPIC (N/A)  Patient Location: PACU  Anesthesia Type:General  Level of Consciousness: awake, alert  and oriented  Airway & Oxygen Therapy: Patient Spontanous Breathing  Post-op Assessment: Report given to RN and Post -op Vital signs reviewed and stable  Post vital signs: Reviewed and stable  Last Vitals:  Vitals:   02/04/17 2200 02/04/17 2321  BP: 127/78 131/79  Pulse: 63 60  Resp:  18  Temp:  37 C    Last Pain:  Vitals:   02/04/17 2321  TempSrc: Oral  PainSc:          Complications: No apparent anesthesia complications

## 2017-02-05 NOTE — Progress Notes (Signed)
Discharge instructions gone over with patient. Home medications gone over. Prescription given. Follow up appointment to be made. Signs and symptoms of infection and or worsening condition gone over. Diet, activity, and reasons to call the doctor discussed. Work Physicist, medicalletter given. Patient verbalized understanding of instructions.

## 2018-02-28 ENCOUNTER — Encounter (HOSPITAL_COMMUNITY): Payer: Self-pay | Admitting: Emergency Medicine

## 2018-02-28 ENCOUNTER — Ambulatory Visit (HOSPITAL_COMMUNITY)
Admission: EM | Admit: 2018-02-28 | Discharge: 2018-02-28 | Disposition: A | Discharge status: Home / Self Care | Payer: Self-pay | Attending: Family Medicine | Admitting: Family Medicine

## 2018-02-28 DIAGNOSIS — K529 Noninfective gastroenteritis and colitis, unspecified: Secondary | ICD-10-CM

## 2018-02-28 NOTE — ED Triage Notes (Signed)
Pt states "I had food poisoning yesterday morning, I haven't vomited today but I feel nausea". Pt also endorses RLQ abdominal pain, pt had appendix removed last year. States "I think theres something messed up there since my surgery".

## 2018-02-28 NOTE — Discharge Instructions (Addendum)
I would strongly recommend you to get establish with a primary care provider to have your right lower quadrant discomfort further evaluated with maybe a CT scan.

## 2018-02-28 NOTE — ED Provider Notes (Signed)
[facility]    CSN: [id number] Arrival date & time: 02/28/18  1219     History   Chief Complaint Chief Complaint  Patient presents with  . Emesis  . Abdominal Pain    HPI Matthew Miranda is a 27 y.o. male.   Comes in today complaining of nausea, and vomiting onset yesterday.  Patient states that he was throwing up constantly yesterday.  No vomiting today but he does feel nauseous.  Patient denies diarrhea.  Patient also denies fever. He does have RLQ abd discomfort.  He had an appendectomy late last year in November 2018 and reports that his right lower quadrant have been feeling "weird" since surgery but "no pain".      History reviewed. No pertinent past medical history.  Patient Active Problem List   Diagnosis Date Noted  . Appendicitis 02/04/2017    Past Surgical History:  Procedure Laterality Date  . APPENDECTOMY    . LAPAROSCOPIC APPENDECTOMY N/A 02/04/2017   Procedure: APPENDECTOMY LAPAROSCOPIC;  Surgeon: Emelia LoronMatthew Wakefield, MD;  [facility];  Service: General;  Laterality: N/A;       Home Medications    Prior to Admission medications   Medication Sig Start Date End Date Taking? Authorizing Provider  oxyCODONE-acetaminophen (PERCOCET/ROXICET) 5-325 MG tablet Take 1-2 tablets by mouth every 4 (four) hours as needed for moderate pain. Patient not taking: Reported on 02/28/2018 02/05/17   Jerre SimonFocht, Jessica L, PA    Family History No family history on file.  Social History Social History   Tobacco Use  . Smoking status: Current Every Day Smoker  Substance Use Topics  . Alcohol use: Yes  . Drug use: Yes    Types: Marijuana     Allergies   Patient has no known allergies.   Review of Systems Review of Systems  Constitutional: Negative for fatigue and fever.  Respiratory: Negative for cough, shortness of breath and wheezing.   Cardiovascular: Negative for chest pain and palpitations.  Gastrointestinal: Positive for abdominal  pain, nausea and vomiting.  Neurological: Negative for dizziness and headaches.     Physical Exam Triage Vital Signs ED Triage Vitals [02/28/18 1303]  Enc Vitals Group     BP (!) 143/84     Pulse Rate 78     Resp 16     Temp 98.2 F (36.8 C)     Temp src      SpO2 99 %     Weight      Height      Head Circumference      Peak Flow      Pain Score      Pain Loc      Pain Edu?      Excl. in GC?    No data found.  Updated Vital Signs BP (!) 143/84   Pulse 78   Temp 98.2 F (36.8 C)   Resp 16   SpO2 99%   Physical Exam  Constitutional: He appears well-developed and well-nourished.  Non-toxic appearance. He does not appear ill. No distress.  Cardiovascular: Normal rate and normal heart sounds.  No murmur heard. Pulmonary/Chest: Effort normal and breath sounds normal. He has no wheezes.  Abdominal: Soft. Normal appearance and bowel sounds are normal. There is no tenderness. There is no tenderness at McBurney's point.  Skin: Skin is warm and dry.  Nursing note and vitals reviewed.    UC Treatments / Results  Labs (all labs ordered are listed, but only abnormal results  are displayed) Labs Reviewed - No data to display  EKG None Radiology No results found.  Procedures Procedures (including critical care time)  Medications Ordered in UC Medications - No data to display   Initial Impression / Assessment and Plan / UC Course  I have reviewed the triage vital signs and the nursing notes.  Pertinent labs & imaging results that were available during my care of the patient were reviewed by me and considered in my medical decision making (see chart for details).  Final Clinical Impressions(s) / UC Diagnoses   Final diagnoses:  Gastroenteritis   Clinical presentation consistent with gastroenteritis.  Patient had no pain of the right lower quadrant on palpation during exam.  Advised patient to establish care with a primary care provider to have the chronic right  lower quadrant discomfort further evaluated.  Courage rest and oral hydration.  Return precautions discussed. ED Discharge Orders    None       Controlled Substance Prescriptions [place] Controlled Substance Registry consulted? Not Applicable   Lucia Estelle, NP 02/28/18 1337

## 2019-10-25 ENCOUNTER — Other Ambulatory Visit: Payer: Self-pay

## 2019-10-25 DIAGNOSIS — Z20822 Contact with and (suspected) exposure to covid-19: Secondary | ICD-10-CM

## 2019-10-27 LAB — NOVEL CORONAVIRUS, NAA: SARS-CoV-2, NAA: NOT DETECTED

## 2019-12-05 ENCOUNTER — Emergency Department (HOSPITAL_COMMUNITY)
Admission: EM | Admit: 2019-12-05 | Discharge: 2019-12-05 | Disposition: A | Discharge status: Home / Self Care | Payer: Self-pay | Attending: Emergency Medicine | Admitting: Emergency Medicine

## 2019-12-05 ENCOUNTER — Encounter (HOSPITAL_COMMUNITY): Payer: Self-pay

## 2019-12-05 ENCOUNTER — Emergency Department (HOSPITAL_COMMUNITY): Payer: Self-pay

## 2019-12-05 ENCOUNTER — Other Ambulatory Visit: Payer: Self-pay

## 2019-12-05 DIAGNOSIS — F17200 Nicotine dependence, unspecified, uncomplicated: Secondary | ICD-10-CM | POA: Insufficient documentation

## 2019-12-05 DIAGNOSIS — W228XXA Striking against or struck by other objects, initial encounter: Secondary | ICD-10-CM | POA: Insufficient documentation

## 2019-12-05 DIAGNOSIS — S93401A Sprain of unspecified ligament of right ankle, initial encounter: Secondary | ICD-10-CM | POA: Insufficient documentation

## 2019-12-05 DIAGNOSIS — Y92838 Other recreation area as the place of occurrence of the external cause: Secondary | ICD-10-CM | POA: Insufficient documentation

## 2019-12-05 DIAGNOSIS — S93601A Unspecified sprain of right foot, initial encounter: Secondary | ICD-10-CM | POA: Insufficient documentation

## 2019-12-05 DIAGNOSIS — Y9389 Activity, other specified: Secondary | ICD-10-CM | POA: Insufficient documentation

## 2019-12-05 DIAGNOSIS — F1210 Cannabis abuse, uncomplicated: Secondary | ICD-10-CM | POA: Insufficient documentation

## 2019-12-05 DIAGNOSIS — Y998 Other external cause status: Secondary | ICD-10-CM | POA: Insufficient documentation

## 2019-12-05 NOTE — Discharge Instructions (Signed)
As we discussed, today your x-rays appear normal.  There is no evidence of any fracture or broken bone.  As we discussed, there still could be a muscle, tendon, ligament strain, sprain, injury.  If you continue to have pain, you can follow-up with referred outpatient orthopedic doctor.  You can take Tylenol or Ibuprofen as directed for pain. You can alternate Tylenol and Ibuprofen every 4 hours. If you take Tylenol at 1pm, then you can take Ibuprofen at 5pm. Then you can take Tylenol again at 9pm.   Follow the RICE (Rest, Ice, Compression, Elevation) protocol as directed.   Return the emergency department for any worsening pain, redness or swelling of the ankle, fevers, numbness/weakness or any other worsening concerning symptoms.
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
## 2019-12-05 NOTE — ED Notes (Signed)
Patient transported to X-ray 

## 2019-12-05 NOTE — ED Triage Notes (Signed)
Pt reports he rolled his right ankle last Wednesday and has been having pain ever since, ambulatory but with pain, no significant swelling noted at this time.

## 2020-07-30 ENCOUNTER — Other Ambulatory Visit: Payer: Self-pay

## 2020-07-30 ENCOUNTER — Emergency Department (HOSPITAL_COMMUNITY)
Admission: EM | Admit: 2020-07-30 | Discharge: 2020-07-30 | Disposition: A | Discharge status: Home / Self Care | Payer: Managed Care, Other (non HMO) | Attending: Emergency Medicine | Admitting: Emergency Medicine

## 2020-07-30 DIAGNOSIS — Y92821 Forest as the place of occurrence of the external cause: Secondary | ICD-10-CM | POA: Insufficient documentation

## 2020-07-30 DIAGNOSIS — M549 Dorsalgia, unspecified: Secondary | ICD-10-CM

## 2020-07-30 DIAGNOSIS — W19XXXA Unspecified fall, initial encounter: Secondary | ICD-10-CM | POA: Insufficient documentation

## 2020-07-30 DIAGNOSIS — Y999 Unspecified external cause status: Secondary | ICD-10-CM | POA: Diagnosis not present

## 2020-07-30 DIAGNOSIS — M546 Pain in thoracic spine: Secondary | ICD-10-CM | POA: Diagnosis present

## 2020-07-30 DIAGNOSIS — R519 Headache, unspecified: Secondary | ICD-10-CM | POA: Insufficient documentation

## 2020-07-30 DIAGNOSIS — M542 Cervicalgia: Secondary | ICD-10-CM | POA: Diagnosis not present

## 2020-07-30 DIAGNOSIS — F17200 Nicotine dependence, unspecified, uncomplicated: Secondary | ICD-10-CM | POA: Insufficient documentation

## 2020-07-30 DIAGNOSIS — Y9301 Activity, walking, marching and hiking: Secondary | ICD-10-CM | POA: Insufficient documentation

## 2020-07-30 MED ORDER — CYCLOBENZAPRINE HCL 10 MG PO TABS: 10.0000 mg | ORAL_TABLET | Freq: Two times a day (BID) | ORAL | 0 refills | Status: DC | PRN

## 2020-07-30 MED ORDER — NAPROXEN 375 MG PO TABS: 375.0000 mg | ORAL_TABLET | Freq: Two times a day (BID) | ORAL | 0 refills | Status: DC
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
## 2020-07-30 NOTE — Discharge Instructions (Signed)
Please refer to attached instructions
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
## 2020-07-30 NOTE — ED Notes (Signed)
Reviewed discharge instructions with patient. Follow-up care and medications reviewed. Patient  verbalized understanding. Patient A&Ox4, VSS, and ambulatory with steady gait upon discharge.  °

## 2020-07-30 NOTE — ED Notes (Signed)
Pt ambulated to room without any difficulties w/ gait

## 2020-08-07 ENCOUNTER — Encounter (HOSPITAL_COMMUNITY): Payer: Self-pay | Admitting: Emergency Medicine

## 2020-08-07 ENCOUNTER — Emergency Department (HOSPITAL_COMMUNITY): Payer: Managed Care, Other (non HMO)

## 2020-08-07 ENCOUNTER — Other Ambulatory Visit: Payer: Self-pay

## 2020-08-07 ENCOUNTER — Emergency Department (HOSPITAL_COMMUNITY)
Admission: EM | Admit: 2020-08-07 | Discharge: 2020-08-07 | Disposition: A | Discharge status: Home / Self Care | Payer: Managed Care, Other (non HMO) | Attending: Emergency Medicine | Admitting: Emergency Medicine

## 2020-08-07 DIAGNOSIS — F17200 Nicotine dependence, unspecified, uncomplicated: Secondary | ICD-10-CM | POA: Diagnosis not present

## 2020-08-07 DIAGNOSIS — M546 Pain in thoracic spine: Secondary | ICD-10-CM | POA: Diagnosis not present

## 2020-08-07 DIAGNOSIS — W0110XA Fall on same level from slipping, tripping and stumbling with subsequent striking against unspecified object, initial encounter: Secondary | ICD-10-CM | POA: Diagnosis not present

## 2020-08-07 DIAGNOSIS — S0990XA Unspecified injury of head, initial encounter: Secondary | ICD-10-CM | POA: Diagnosis not present

## 2020-08-07 MED ORDER — METHOCARBAMOL 500 MG PO TABS: 500.0000 mg | ORAL_TABLET | Freq: Two times a day (BID) | ORAL | 0 refills | Status: AC

## 2020-08-07 MED ORDER — LIDOCAINE 5 % EX PTCH: 1.0000 | MEDICATED_PATCH | CUTANEOUS | 0 refills | Status: DC

## 2020-08-07 NOTE — Discharge Instructions (Addendum)
At this time there does not appear to be the presence of an emergent medical condition, however there is always the potential for conditions to change. Please read and follow the below instructions.  Please return to the Emergency Department immediately for any new or worsening symptoms. Please be sure to follow up with your Primary Care Provider within one week regarding your visit today; please call their office to schedule an appointment even if you are feeling better for a follow-up visit. You may use the muscle relaxer Robaxin as prescribed to help with your symptoms.  Do not drive or operate heavy machinery while taking Robaxin as it will make you drowsy.  Do not drink alcohol or take other sedating medications while taking Robaxin as this will worsen side effects. You may use the Lidoderm patch as prescribed to help with your symptoms.  Lidoderm may be expensive so you may speak with your pharmacist about finding over-the-counter medications that work similarly such as Salon patches. You may use over-the-counter anti-inflammatory such as Tylenol and ibuprofen as directed on the packaging to help with your symptoms. You may call the orthopedic specialist Dr. Roda Shutters for further evaluation regarding your back pain. You may call the concussion clinic with Dr. Katrinka Blazing for further evaluation. Your CT scan showed some paranasal sinus mucosal thickening.  Discuss this with your primary care provider at your follow-up visit.  Go to the nearest Emergency Department immediately if: You have fever or chills You develop new bowel or bladder control problems. You have unusual weakness or numbness in your arms or legs. You develop nausea or vomiting. You develop abdominal pain. You feel faint. You have: A very bad headache that is not helped by medicine. Trouble walking or weakness in your arms and legs. Clear or bloody fluid coming from your nose or ears. Changes in how you see (vision). Shaking movements  that you cannot control. You lose your balance. You vomit. The black centers of your eyes (pupils) change in size. Your speech is slurred. Your dizziness gets worse. You pass out. You are sleepier than normal and have trouble staying awake. You have any new/concerning or worsening of symptoms   Please read the additional information packets attached to your discharge summary.  Do not take your medicine if  develop an itchy rash, swelling in your mouth or lips, or difficulty breathing; call 911 and seek immediate emergency medical attention if this occurs.  You may review your lab tests and imaging results in their entirety on your MyChart account.  Please discuss all results of fully with your primary care provider and other specialist at your follow-up visit.  Note: Portions of this text may have been transcribed using voice recognition software. Every effort was made to ensure accuracy; however, inadvertent computerized transcription errors may still be present.
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
## 2020-08-07 NOTE — ED Triage Notes (Signed)
C/o thoracic back pain and headache x 1 month.  States he was seen in ED approx 1 week ago for same and is taking muscle relaxer that helps when taking the medication.  States he is unable to take it and work and when he moves pain is severe and unable to work.  Rates pain 1/10 at present.

## 2020-08-07 NOTE — ED Notes (Signed)
Patient transported to CT 

## 2020-08-18 ENCOUNTER — Ambulatory Visit: Payer: Managed Care, Other (non HMO) | Admitting: Family Medicine

## 2021-03-05 IMAGING — CR DG THORACIC SPINE 2V
3 series · 3 of 3 positions shown · non-contrast
Comparison: None.

CLINICAL DATA: Status post fall 1 month ago with midthoracic spine
pain.

EXAM:
THORACIC SPINE 2 VIEWS

[t-spine ap]
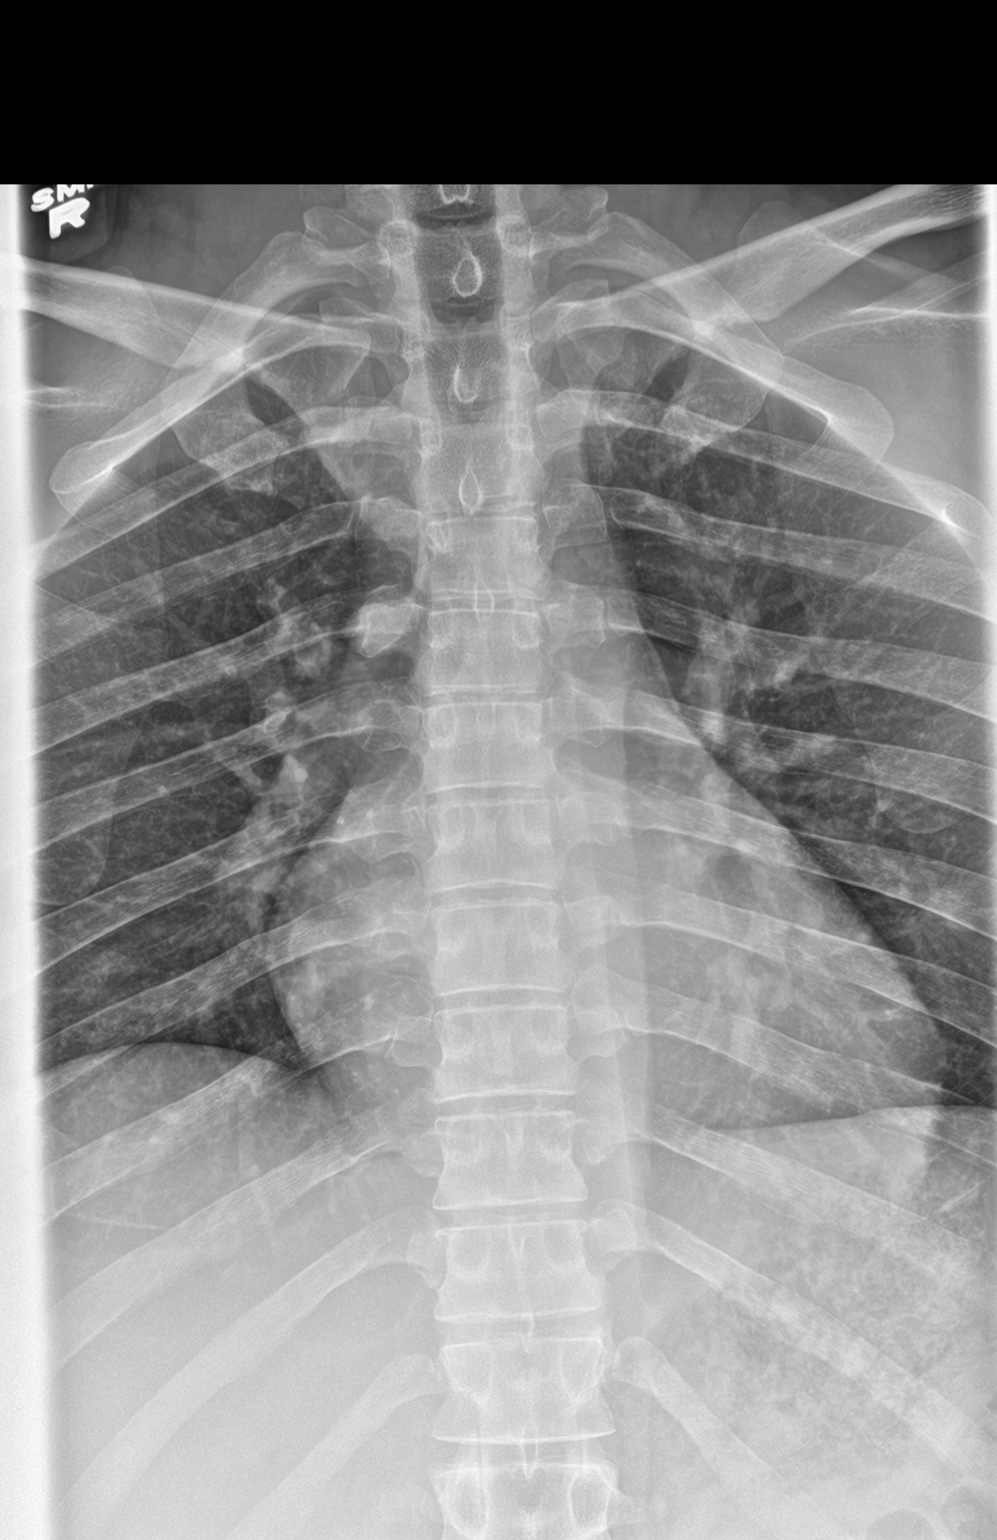

[t-spine lat]
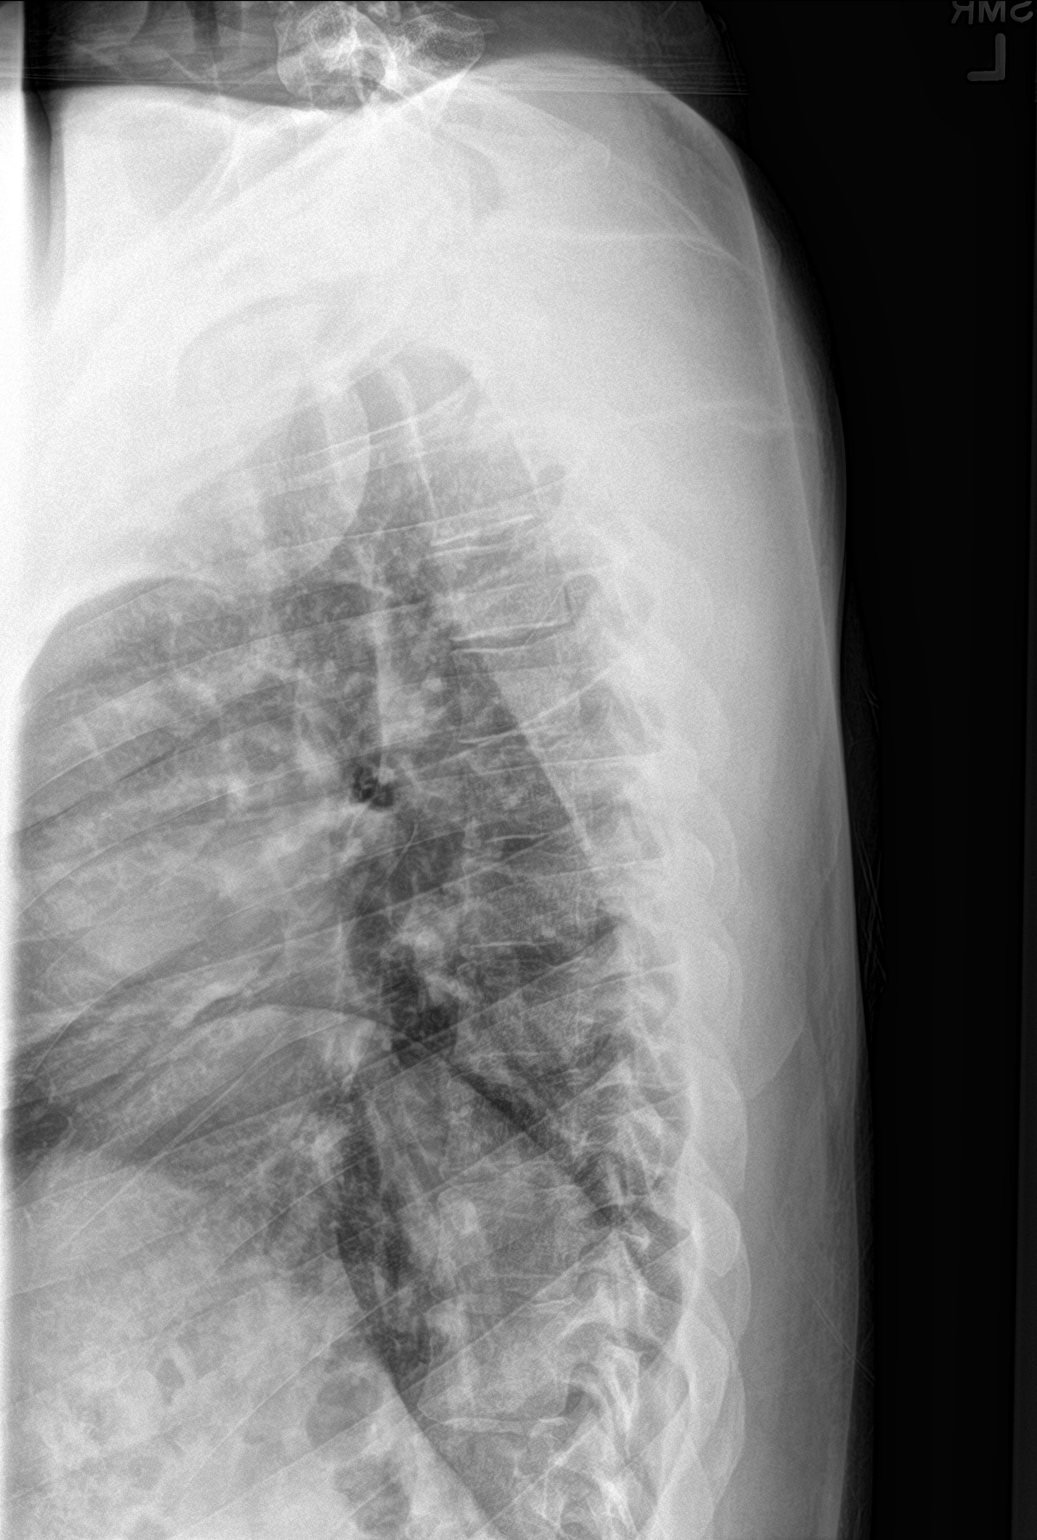

[t-spine swimmers]
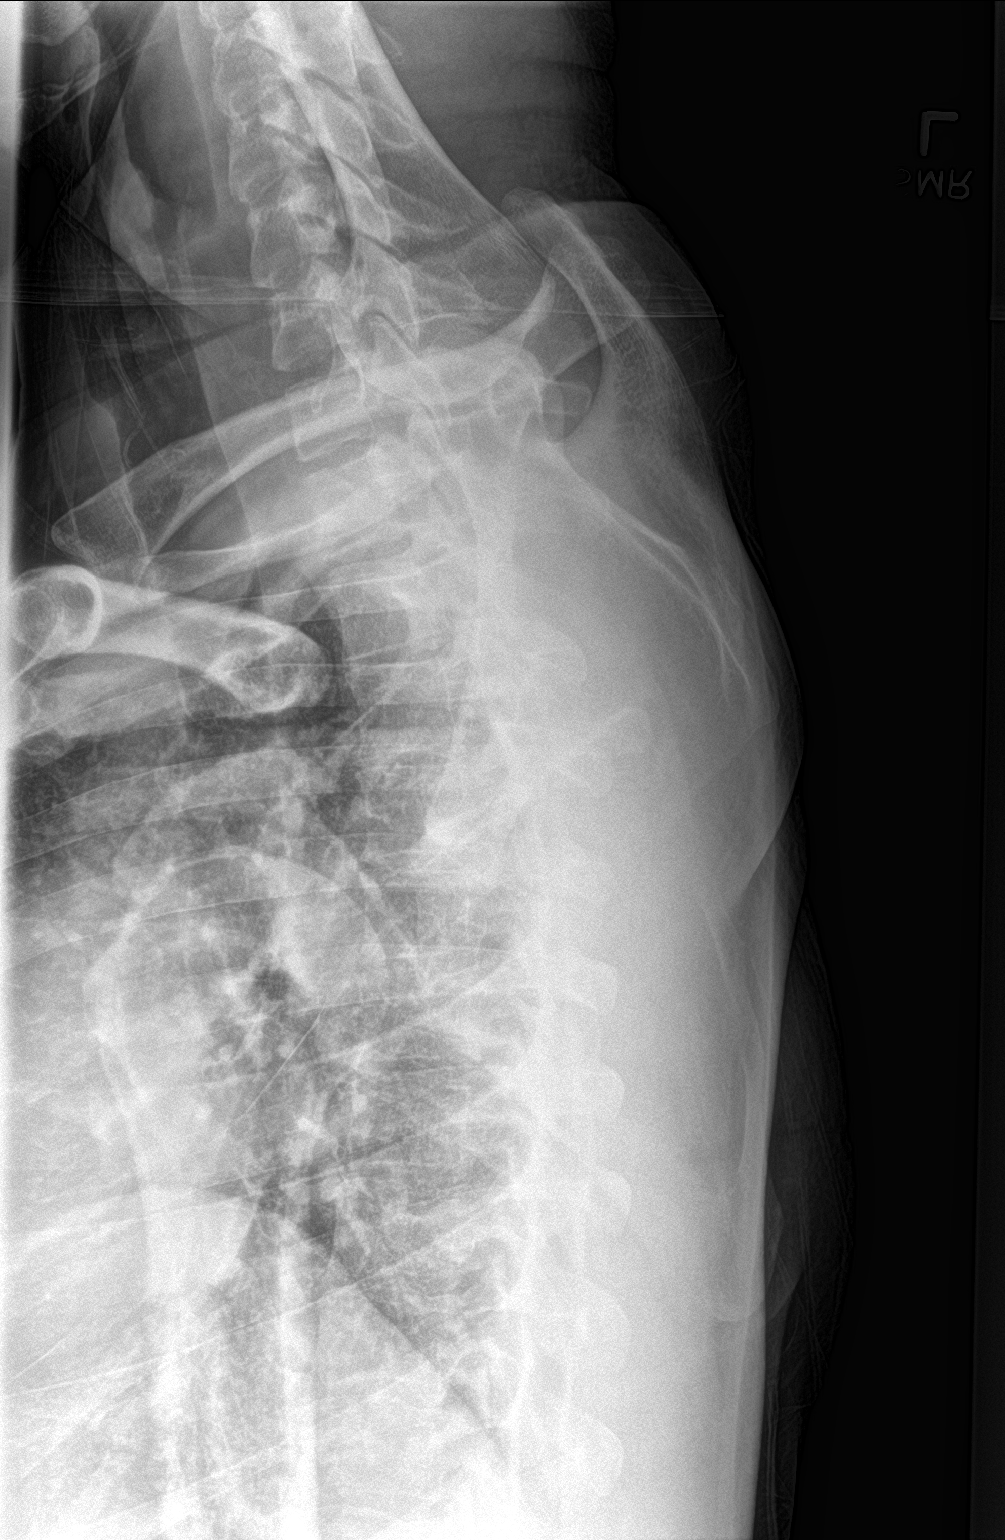

[3 of 3 positions shown; findings below may reference images not displayed]

FINDINGS: There is no evidence of thoracic spine fracture. Alignment is
normal. No other significant bone abnormalities are identified.
IMPRESSION: Negative.
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
## 2021-11-01 ENCOUNTER — Other Ambulatory Visit: Payer: Self-pay

## 2021-11-01 ENCOUNTER — Telehealth: Payer: Self-pay | Admitting: Surgery

## 2021-11-01 ENCOUNTER — Emergency Department (HOSPITAL_COMMUNITY)
Admission: EM | Admit: 2021-11-01 | Discharge: 2021-11-01 | Disposition: A | Discharge status: Home / Self Care | Payer: Managed Care, Other (non HMO) | Attending: Emergency Medicine | Admitting: Emergency Medicine

## 2021-11-01 ENCOUNTER — Encounter (HOSPITAL_COMMUNITY): Payer: Self-pay | Admitting: Emergency Medicine

## 2021-11-01 ENCOUNTER — Telehealth (HOSPITAL_COMMUNITY): Payer: Self-pay | Admitting: Emergency Medicine

## 2021-11-01 DIAGNOSIS — K0889 Other specified disorders of teeth and supporting structures: Secondary | ICD-10-CM

## 2021-11-01 DIAGNOSIS — F17200 Nicotine dependence, unspecified, uncomplicated: Secondary | ICD-10-CM | POA: Insufficient documentation

## 2021-11-01 DIAGNOSIS — K029 Dental caries, unspecified: Secondary | ICD-10-CM | POA: Insufficient documentation

## 2021-11-01 MED ORDER — OXYCODONE-ACETAMINOPHEN 5-325 MG PO TABS: 1.0000 | ORAL_TABLET | Freq: Four times a day (QID) | ORAL | 0 refills | Status: DC | PRN

## 2021-11-01 MED ORDER — AMOXICILLIN-POT CLAVULANATE 875-125 MG PO TABS: 1.0000 | ORAL_TABLET | Freq: Two times a day (BID) | ORAL | 0 refills | Status: DC

## 2021-11-01 MED ORDER — OXYCODONE-ACETAMINOPHEN 5-325 MG PO TABS: 1.0000 | ORAL_TABLET | Freq: Three times a day (TID) | ORAL | 0 refills | Status: DC | PRN

## 2021-11-01 NOTE — ED Notes (Signed)
Discharged by PA at triage. 

## 2021-11-01 NOTE — ED Provider Notes (Deleted)
Emergency Medicine Provider Triage Evaluation Note  Matthew Miranda , a 30 y.o. male  was evaluated in triage.  Pt complains of dental pain. States that same began 2 months ago when his filling fell out. He had been putting off going to the dentist and managing pain with OTC meds until last night when it acutely worsened. Unable to get in with a dentist today. Denies fevers or chills  Review of Systems  Positive: Dental pain Negative: Fevers, chills  Physical Exam  BP (!) 157/106 (BP Location: Right Arm)    Pulse 67    Temp 99 F (37.2 C) (Oral)    Resp 16    SpO2 100%  Gen:   Awake, no distress   Resp:  Normal effort  MSK:   Moves extremities without difficulty  Other:  No visualized abscess  Medical Decision Making  Medically screening exam initiated at 10:17 AM.  Appropriate orders placed.  Cheyne Briley Sulton was informed that the remainder of the evaluation will be completed by another provider, this initial triage assessment does not replace that evaluation, and the importance of remaining in the ED until their evaluation is complete.  Pain control   Silva Bandy, PA-C 11/01/21 1019

## 2021-11-01 NOTE — Telephone Encounter (Signed)
Pt seen earlier for dental pain. Prescribed percocet for pain I called [facility] and canceled this prescription I sent a very short coarse of percocet to [facility] on [address].

## 2021-11-01 NOTE — ED Triage Notes (Signed)
C/o L upper dental pain x 2 months that is worse since yesterday.  Denies fever and chills.

## 2021-11-01 NOTE — Discharge Instructions (Signed)
I have given you antibiotics and pain medication for your dental pain. Please take meds and follow-up with your dentist for further management.   Do not drive or operate heavy machinery on pain medication

## 2021-11-01 NOTE — ED Provider Notes (Signed)
[facility] EMERGENCY DEPARTMENT Provider Note   CSN: [id number] Arrival date & time: 11/01/21  6063     History Chief Complaint  Patient presents with   Dental Pain    Matthew Miranda is a 30 y.o. male.  Pt with no pertinent past medical history presents today with complaint of dental pain. States that same began 2 months ago when his filling fell out. He had been putting off going to the dentist and managing pain with OTC meds until last night when it acutely worsened. Unable to get in with a dentist today. Denies fevers or chills. No drainage.  The history is provided by the patient. No language interpreter was used.  Dental Pain Associated symptoms: no drooling and no fever       History reviewed. No pertinent past medical history.  Patient Active Problem List   Diagnosis Date Noted   Appendicitis 02/04/2017    Past Surgical History:  Procedure Laterality Date   APPENDECTOMY     LAPAROSCOPIC APPENDECTOMY N/A 02/04/2017   Procedure: APPENDECTOMY LAPAROSCOPIC;  Surgeon: Emelia Loron, MD;  Location: [facility] OR;  Service: General;  Laterality: N/A;       No family history on file.  Social History   Tobacco Use   Smoking status: Every Day  Substance Use Topics   Alcohol use: Yes   Drug use: Yes    Types: Marijuana    Home Medications Prior to Admission medications   Medication Sig Start Date End Date Taking? Authorizing Provider  lidocaine (LIDODERM) 5 % Place 1 patch onto the skin daily. Remove & Discard patch within 12 hours or as directed by MD 08/07/20   Harlene Salts A, PA-C  naproxen (NAPROSYN) 375 MG tablet Take 1 tablet (375 mg total) by mouth 2 (two) times daily. 07/30/20   Felicie Morn, NP    Allergies    Patient has no known allergies.  Review of Systems   Review of Systems  Constitutional:  Negative for chills and fever.  HENT:  Positive for dental problem. Negative for drooling.   Gastrointestinal:  Negative for  diarrhea, nausea and vomiting.  All other systems reviewed and are negative.  Physical Exam Updated Vital Signs BP (!) 157/106 (BP Location: Right Arm)    Pulse 67    Temp 99 F (37.2 C) (Oral)    Resp 16    SpO2 100%   Physical Exam Vitals and nursing note reviewed.  Constitutional:      Appearance: Normal appearance. He is normal weight.     Comments: Patient sitting comfortably in bed in no distress  HENT:     Head: Normocephalic and atraumatic.     Nose: Nose normal.     Mouth/Throat:     Mouth: Mucous membranes are moist.     Dentition: Abnormal dentition. Dental tenderness and dental caries present. No dental abscesses.     Pharynx: No oropharyngeal exudate or posterior oropharyngeal erythema.     Comments: Dental caries present throughout the mouth. No visible abscess. Tenderness noted over the top left molars. Neurological:     Mental Status: He is alert.    ED Results / Procedures / Treatments   Labs (all labs ordered are listed, but only abnormal results are displayed) Labs Reviewed - No data to display  EKG None  Radiology No results found.  Procedures Procedures   Medications Ordered in ED Medications - No data to display  ED Course  I have reviewed the triage  vital signs and the nursing notes.  Pertinent labs & imaging results that were available during my care of the patient were reviewed by me and considered in my medical decision making (see chart for details).    MDM Rules/Calculators/A&P                         Patient with toothache x 2 months.  No gross abscess.  Exam unconcerning for Ludwig's angina or spread of infection.  Will treat with antibiotics and pain control. Urged patient to follow-up with dentist.  Patient understanding, discharged in stable condition    Final Clinical Impression(s) / ED Diagnoses Final diagnoses:  Pain, dental    Rx / DC Orders ED Discharge Orders     None     An After Visit Summary was printed and  given to the patient.    Vear Clock 11/01/21 1100    Margarita Grizzle, MD 11/02/21 [phone]

## 2021-11-01 NOTE — Telephone Encounter (Signed)
ED RNCM received call from patient concerning [facility] being out of percocet, This RN CM called to verify. Confirmed with patient to have replacement prescription sent to the [facility] on [address]. Contacted prescriber to cancel and resend prescriptions to [facility]. Patient has been updated with this information  Michel Bickers RN, BSN  ED Care Manager [phone]

## 2022-01-08 ENCOUNTER — Emergency Department (HOSPITAL_COMMUNITY)
Admission: EM | Admit: 2022-01-08 | Discharge: 2022-01-08 | Disposition: A | Discharge status: Home / Self Care | Payer: Self-pay | Attending: Student | Admitting: Student

## 2022-01-08 ENCOUNTER — Other Ambulatory Visit: Payer: Self-pay

## 2022-01-08 DIAGNOSIS — F17200 Nicotine dependence, unspecified, uncomplicated: Secondary | ICD-10-CM | POA: Insufficient documentation

## 2022-01-08 DIAGNOSIS — K047 Periapical abscess without sinus: Secondary | ICD-10-CM | POA: Insufficient documentation

## 2022-01-08 MED ORDER — AMOXICILLIN-POT CLAVULANATE 875-125 MG PO TABS: 1.0000 | ORAL_TABLET | Freq: Two times a day (BID) | ORAL | 0 refills | Status: DC

## 2022-01-08 MED ORDER — CHLORHEXIDINE GLUCONATE 0.12 % MT SOLN: 15.0000 mL | Freq: Two times a day (BID) | OROMUCOSAL | 0 refills | Status: DC

## 2022-01-08 NOTE — Discharge Instructions (Signed)
Please call to make an appointment with the dentist I given you the information for.  Use the mouth rinse I have prescribed you twice daily and take antibiotics as prescribed.

## 2022-01-08 NOTE — ED Triage Notes (Signed)
Pt here POV d/t left side facial swelling. Unable to been seen by dentist d/t insurance. Pain endorsed, 2/10

## 2022-01-08 NOTE — ED Provider Notes (Signed)
[facility] EMERGENCY DEPARTMENT Provider Note   CSN: [id number] Arrival date & time: 01/08/22  1215     History   Chief Complaint Chief Complaint  Patient presents with   Facial Swelling    HPI Matthew Miranda is a 31 y.o. male.  Patient presents to the emergency department with a dental complaint. Symptoms began 3 days ago. The patient has tried to alleviate pain with nothing.  Pain rated as moderate, characterized as throbbing in nature and located left upper dentition. Patient denies fever, night sweats, chills, difficulty swallowing or opening mouth, SOB, nuchal rigidity or decreased ROM of neck.  Patient does not have a dentist and requests a resource guide at discharge.     HPI  No past medical history on file.  Patient Active Problem List   Diagnosis Date Noted   Appendicitis 02/04/2017    Past Surgical History:  Procedure Laterality Date   APPENDECTOMY     LAPAROSCOPIC APPENDECTOMY N/A 02/04/2017   Procedure: APPENDECTOMY LAPAROSCOPIC;  Surgeon: Rolm Bookbinder, MD;  Location: [place];  Service: General;  Laterality: N/A;        Home Medications    Prior to Admission medications   Medication Sig Start Date End Date Taking? Authorizing Provider  chlorhexidine (PERIDEX) 0.12 % solution Use as directed 15 mLs in the mouth or throat 2 (two) times daily. 01/08/22  Yes Jamey Demchak S, PA  amoxicillin-clavulanate (AUGMENTIN) 875-125 MG tablet Take 1 tablet by mouth every 12 (twelve) hours. 01/08/22   Tedd Sias, PA  lidocaine (LIDODERM) 5 % Place 1 patch onto the skin daily. Remove & Discard patch within 12 hours or as directed by MD 08/07/20   Nuala Alpha A, PA-C  naproxen (NAPROSYN) 375 MG tablet Take 1 tablet (375 mg total) by mouth 2 (two) times daily. 07/30/20   Etta Quill, NP  oxyCODONE-acetaminophen (PERCOCET/ROXICET) 5-325 MG tablet Take 1 tablet by mouth every 6 (six) hours as needed for severe pain. 11/01/21   Tedd Sias, PA    Family History No family history on file.  Social History Social History   Tobacco Use   Smoking status: Every Day  Substance Use Topics   Alcohol use: Yes   Drug use: Yes    Types: Marijuana     Allergies   Patient has no known allergies.   Review of Systems Denies fevers, chills, difficulty swallowing or eating, changes in voice, pain under tongue, nausea, vomiting, lightheadedness or dizziness. No trismus   Physical Exam Updated Vital Signs BP 121/76    Pulse 84    Temp 98.1 F (36.7 C) (Oral)    Resp 14    SpO2 98%   Physical Exam Physical Exam  Constitutional: Pt appears well-developed and well-nourished.  HENT:  Head: Normocephalic.  Right Ear: Tympanic membrane, external ear and ear canal normal.  Left Ear: Tympanic membrane, external ear and ear canal normal.  Nose: Nose normal. Right sinus exhibits no maxillary sinus tenderness and no frontal sinus tenderness. Left sinus exhibits no maxillary sinus tenderness and no frontal sinus tenderness.  Mouth/Throat: Uvula is midline, oropharynx is clear and moist and mucous membranes are normal. No oral lesions. No uvula swelling or lacerations. No oropharyngeal exudate, posterior oropharyngeal edema, posterior oropharyngeal erythema or tonsillar abscesses.  Poor dentition No gingival swelling, fluctuance or induration No gross abscess  No sublingual edema, tenderness to palpation, or sign of Ludwig's angina, or deep space infection Pain at left upper gumline there  is some swelling.  No fluctuance.  No trismus. Eyes: Conjunctivae are normal. Pupils are equal, round, and reactive to light. Right eye exhibits no discharge. Left eye exhibits no discharge.  Neck: Normal range of motion. Neck supple.  No stridor Handling secretions without difficulty No nuchal rigidity No cervical lymphadenopathy Cardiovascular: Normal rate, regular rhythm and normal heart sounds.   Pulmonary/Chest: Effort normal. No  respiratory distress.  Equal chest rise  Abdominal: Soft. Bowel sounds are normal. Pt exhibits no distension. There is no tenderness.  Lymphadenopathy: Pt has no cervical adenopathy.  Neurological: Pt is alert and oriented x 4  Skin: Skin is warm and dry.  Psychiatric: Pt has a normal mood and affect.  Nursing note and vitals reviewed.   ED Treatments / Results  Labs (all labs ordered are listed, but only abnormal results are displayed) Labs Reviewed - No data to display  EKG    Radiology No results found.  Procedures Procedures (including critical care time)  Medications Ordered in ED Medications - No data to display   Initial Impression / Assessment and Plan / ED Course  I have reviewed the triage vital signs and the nursing notes.  Pertinent labs & imaging results that were available during my care of the patient were reviewed by me and considered in my medical decision making (see chart for details).        Patient with dentalgia.  No abscess requiring immediate incision and drainage.  Exam not concerning for Ludwig's angina or pharyngeal abscess.  Will treat with augmentin and peridex. Pt instructed to follow-up with dentist.  Discussed return precautions. Pt safe for discharge.   Final Clinical Impressions(s) / ED Diagnoses   Final diagnoses:  Dental infection    ED Discharge Orders          Ordered    amoxicillin-clavulanate (AUGMENTIN) 875-125 MG tablet  Every 12 hours,   Status:  Discontinued        01/08/22 1418    amoxicillin-clavulanate (AUGMENTIN) 875-125 MG tablet  Every 12 hours        01/08/22 1419    chlorhexidine (PERIDEX) 0.12 % solution  2 times daily        01/08/22 1419              Tedd Sias, [place] 01/08/22 1815    Teressa Lower, MD 01/09/22 [phone]

## 2022-01-08 NOTE — ED Provider Triage Note (Signed)
Emergency Medicine Provider Triage Evaluation Note  Matthew Miranda , a 31 y.o. male  was evaluated in triage.  Pt complains of left-sided facial swelling.  Patient noticed swelling this morning.  Also complains of pressure and pain to the site.  In December had dental pain after a filling fell out, was unable to follow-up with dentist after this time.  Reports that he had an episode of neck stiffness 2 weeks prior.  No neck stiffness since then.  Denies any trouble swallowing, trismus, hot potato voice, drooling, trouble swallowing, pain with swallowing, neck pain, rash, chills or events.  Review of Systems  Positive: Left-sided facial swelling Negative: See above  Physical Exam  BP 121/76    Pulse 84    Temp 98.1 F (36.7 C) (Oral)    Resp 14    SpO2 98%  Gen:   Awake, no distress   Resp:  Normal effort  MSK:   Moves extremities without difficulty  Other:  Swelling noted to left cheek.  Tonsils +2 bilaterally with no exudate, or erythema.  Handles oral secretions without difficulty.  Swelling and tenderness noted to left buccal mucosa.  Medical Decision Making  Medically screening exam initiated at 12:59 PM.  Appropriate orders placed.  Matthew Miranda was informed that the remainder of the evaluation will be completed by another provider, this initial triage assessment does not replace that evaluation, and the importance of remaining in the ED until their evaluation is complete.  Concern for possible dental abscess.   Matthew Miranda, [place] 01/08/22 1301

## 2023-10-19 ENCOUNTER — Emergency Department (HOSPITAL_COMMUNITY)
Admission: EM | Admit: 2023-10-19 | Discharge: 2023-10-19 | Disposition: A | Discharge status: Home / Self Care | Payer: Self-pay | Attending: Emergency Medicine | Admitting: Emergency Medicine

## 2023-10-19 ENCOUNTER — Encounter (HOSPITAL_COMMUNITY): Payer: Self-pay | Admitting: Emergency Medicine

## 2023-10-19 DIAGNOSIS — Y92410 Unspecified street and highway as the place of occurrence of the external cause: Secondary | ICD-10-CM | POA: Diagnosis not present

## 2023-10-19 DIAGNOSIS — T148XXA Other injury of unspecified body region, initial encounter: Secondary | ICD-10-CM | POA: Diagnosis present

## 2023-10-19 DIAGNOSIS — M791 Myalgia, unspecified site: Secondary | ICD-10-CM

## 2023-10-19 MED ORDER — NAPROXEN 500 MG PO TABS: 500.0000 mg | ORAL_TABLET | Freq: Two times a day (BID) | ORAL | 0 refills | Status: DC

## 2023-10-19 MED ORDER — KETOROLAC TROMETHAMINE 15 MG/ML IJ SOLN
15.0000 mg | Freq: Once | INTRAMUSCULAR | Status: AC
  Administered 2023-10-19: 15 mg via INTRAMUSCULAR
  Filled 2023-10-19: qty 1

## 2023-10-19 MED ORDER — METHOCARBAMOL 500 MG PO TABS: 500.0000 mg | ORAL_TABLET | Freq: Two times a day (BID) | ORAL | 0 refills | Status: DC
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
## 2023-10-19 NOTE — Discharge Instructions (Addendum)
I have prescribed robaxin, a muscle relaxant. Do not take if you are going to drive as it will cause drowsiness. You may take the prescribed naprosyn twice daily. You may also take up to 1000mg  acetaminophen(Tylenol) up to every 8 hours. Your soreness should begin to subside over the next week.

## 2024-06-02 ENCOUNTER — Other Ambulatory Visit: Payer: Self-pay

## 2024-06-02 ENCOUNTER — Emergency Department (HOSPITAL_COMMUNITY)
Admission: EM | Admit: 2024-06-02 | Discharge: 2024-06-02 | Disposition: A | Discharge status: Home / Self Care | Payer: Self-pay | Attending: Emergency Medicine | Admitting: Emergency Medicine

## 2024-06-02 DIAGNOSIS — K029 Dental caries, unspecified: Secondary | ICD-10-CM | POA: Insufficient documentation

## 2024-06-02 DIAGNOSIS — K047 Periapical abscess without sinus: Secondary | ICD-10-CM | POA: Insufficient documentation

## 2024-06-02 MED ORDER — AMOXICILLIN-POT CLAVULANATE 875-125 MG PO TABS: 1.0000 | ORAL_TABLET | Freq: Two times a day (BID) | ORAL | 0 refills | Status: AC

## 2024-06-02 NOTE — Discharge Instructions (Addendum)
 Please read and follow all provided instructions.  Your diagnoses today include:  1. Dental abscess     The exam and treatment you received today has been provided on an emergency basis only. This is not a substitute for complete medical or dental care.  Tests performed today include: Vital signs. See below for your results today.   Medications prescribed:  Augmentin  - antibiotic  You have been prescribed an antibiotic medicine: take the entire course of medicine even if you are feeling better. Stopping early can cause the antibiotic not to work.  Please use over-the-counter NSAID medications (ibuprofen, naproxen ) or Tylenol  (acetaminophen ) as directed on the packaging for pain -- as long as you do not have any reasons avoid these medications. Reasons to avoid NSAID medications include: weak kidneys, a history of bleeding in your stomach or gut, or uncontrolled high blood pressure or previous heart attack. Reasons to avoid Tylenol  include: liver problems or ongoing alcohol use. Never take more than 4000mg  or 8 Extra strength Tylenol  in a 24 hour period.     Take any prescribed medications only as directed.  Home care instructions:  Follow any educational materials contained in this packet.  Follow-up instructions: Please follow-up with your dentist for further evaluation of your symptoms.   Dental Assistance: See attached dental referral and/or resource guide.   Return instructions:  Please return to the Emergency Department if you experience worsening symptoms. Please return if you develop a fever, you develop more swelling in your face or neck, you have trouble breathing or swallowing food. Please return if you have any other emergent concerns.  Additional Information:  Your vital signs today were: BP (!) 136/109 (BP Location: Right Arm)   Pulse 83   Temp 98.1 F (36.7 C) (Oral)   Resp 18   SpO2 95%  If your blood pressure (BP) was elevated above 135/85 this visit, please  have this repeated by your doctor within one month. --------------

## 2024-06-02 NOTE — ED Triage Notes (Signed)
 Pt reports recurring left upper tooth pain. Has dentist appt in a couple weeks. No fever

## 2024-06-02 NOTE — ED Provider Notes (Signed)
 [place] EMERGENCY DEPARTMENT AT [facility] Provider Note   CSN: [id number] Arrival date & time: 06/02/24  1341     Patient presents with: No chief complaint on file.   Matthew Miranda is a 33 y.o. male.   Patient presents to the emergency department today for evaluation of worsening left upper dental pain and swelling.  Symptoms started 2 to 3 days ago.  No fevers, nausea or vomiting.  He has been taking Tylenol  without sufficient improvement in pain.  He has a history of dental infections in this tooth.       Prior to Admission medications   Medication Sig Start Date End Date Taking? Authorizing Provider  amoxicillin -clavulanate (AUGMENTIN ) 875-125 MG tablet Take 1 tablet by mouth every 12 (twelve) hours. 06/02/24  Yes Antone Summons, PA-C    Allergies: Patient has no known allergies.    Review of Systems  Updated Vital Signs BP (!) 136/109 (BP Location: Right Arm)   Pulse 83   Temp 98.1 F (36.7 C) (Oral)   Resp 18   SpO2 95%   Physical Exam Vitals and nursing note reviewed.  Constitutional:      Appearance: He is well-developed.  HENT:     Head: Normocephalic and atraumatic.     Jaw: No trismus.     Left Ear: Tympanic membrane, ear canal and external ear normal.     Nose: Nose normal.     Mouth/Throat:     Dentition: Abnormal dentition. Dental caries present. No dental abscesses.     Pharynx: Uvula midline. No uvula swelling.     Tonsils: No tonsillar abscesses.     Comments: Tooth #14 is broken the gumline.  Patient does have some swelling at the base of the gums without significant fluctuance consistent with likely developing abscess.  Mild tenderness.  No trismus or facial swelling. Eyes:     Pupils: Pupils are equal, round, and reactive to light.  Neck:     Comments: No neck swelling or Lugwig's angina Musculoskeletal:     Cervical back: Normal range of motion and neck supple.  Skin:    General: Skin is warm and dry.   Neurological:     Mental Status: He is alert.  Psychiatric:        Mood and Affect: Mood normal.     (all labs ordered are listed, but only abnormal results are displayed) Labs Reviewed - No data to display  EKG: None  Radiology: No results found.   Procedures   Medications Ordered in the ED - No data to display  Patient seen and examined. History obtained directly from patient.   Labs/EKG: None ordered.  Imaging: None ordered.  Medications/Fluids: None ordered  Most recent vital signs reviewed and are as follows: BP (!) 136/109 (BP Location: Right Arm)   Pulse 83   Temp 98.1 F (36.7 C) (Oral)   Resp 18   SpO2 95%   Initial impression: dental pain/dental infection  Plan: Discharge to home.   Prescriptions written for: Augmentin   Other home care instructions discussed: Avoidance of chewing or other activities that makes the symptoms worse. Eat soft foods if needed and maintain good hydration.   ED return instructions discussed: Encouraged patient to return with worsening facial or neck swelling, difficulty breathing or swallowing, fever.   Follow-up instructions discussed: Patient encouraged to follow-up with provided dental referral, resources -- or their own dentist if able.  Medical Decision Making Risk Prescription drug management.   Patient presents for dental pain. They do not have a fever and do not appear septic. Exam unconcerning for Ludwig's angina or other deep tissue infection in neck and I do not feel that advanced imaging is indicated at this time. Low suspicion for PTA, RPA, epiglottis based on exam.   Patient will be treated for dental infection with antibiotic. Encouraged tylenol /NSAIDs as prescribed or as directed on the packaging for pain. Encouraged follow-up with a dentist for definitive and long-term management.       Final diagnoses:  Dental abscess    ED Discharge Orders          Ordered     amoxicillin -clavulanate (AUGMENTIN ) 875-125 MG tablet  Every 12 hours        06/02/24 1349               Desiderio Chew, PA-C 06/02/24 1352    Dasie Faden, MD 06/03/24 1428
